# Patient Record
Sex: Male | Born: 1960
Health system: Southern US, Community
[De-identification: ages and names within clinical notes are randomized; demographics above are authoritative.]

## PROBLEM LIST (undated history)

## (undated) DIAGNOSIS — E7800 Pure hypercholesterolemia, unspecified: Secondary | ICD-10-CM

## (undated) DIAGNOSIS — R5381 Other malaise: Secondary | ICD-10-CM

## (undated) DIAGNOSIS — R5383 Other fatigue: Secondary | ICD-10-CM

## (undated) DIAGNOSIS — K219 Gastro-esophageal reflux disease without esophagitis: Secondary | ICD-10-CM

## (undated) DIAGNOSIS — J302 Other seasonal allergic rhinitis: Secondary | ICD-10-CM

## (undated) DIAGNOSIS — F959 Tic disorder, unspecified: Secondary | ICD-10-CM

## (undated) DIAGNOSIS — I639 Cerebral infarction, unspecified: Secondary | ICD-10-CM

## (undated) DIAGNOSIS — J9801 Acute bronchospasm: Secondary | ICD-10-CM

## (undated) DIAGNOSIS — K5909 Other constipation: Secondary | ICD-10-CM

## (undated) DIAGNOSIS — I4891 Unspecified atrial fibrillation: Secondary | ICD-10-CM

## (undated) HISTORY — DX: Pure hypercholesterolemia, unspecified: E78.00

## (undated) HISTORY — DX: Tic disorder, unspecified: F95.9

## (undated) HISTORY — PX: APPENDECTOMY: SHX54

## (undated) HISTORY — DX: Other malaise: R53.81

## (undated) HISTORY — DX: Other seasonal allergic rhinitis: J30.2

## (undated) HISTORY — DX: Gastro-esophageal reflux disease without esophagitis: K21.9

## (undated) HISTORY — DX: Other fatigue: R53.83

## (undated) HISTORY — DX: Cerebral infarction, unspecified: I63.9

## (undated) HISTORY — DX: Acute bronchospasm: J98.01

## (undated) HISTORY — DX: Other constipation: K59.09

---

## 2011-01-26 ENCOUNTER — Ambulatory Visit: Payer: Self-pay | Admitting: Emergency Medicine

## 2011-01-28 ENCOUNTER — Encounter: Payer: Self-pay | Admitting: *Deleted

## 2011-02-02 ENCOUNTER — Encounter: Payer: Self-pay | Admitting: *Deleted

## 2011-02-02 DIAGNOSIS — E7800 Pure hypercholesterolemia, unspecified: Secondary | ICD-10-CM

## 2011-02-02 DIAGNOSIS — J302 Other seasonal allergic rhinitis: Secondary | ICD-10-CM | POA: Insufficient documentation

## 2011-02-02 DIAGNOSIS — E349 Endocrine disorder, unspecified: Secondary | ICD-10-CM | POA: Insufficient documentation

## 2011-02-09 ENCOUNTER — Encounter: Payer: Self-pay | Admitting: Emergency Medicine

## 2011-02-09 ENCOUNTER — Ambulatory Visit (INDEPENDENT_AMBULATORY_CARE_PROVIDER_SITE_OTHER): Payer: Federal, State, Local not specified - PPO | Admitting: Emergency Medicine

## 2011-02-09 ENCOUNTER — Telehealth: Payer: Self-pay

## 2011-02-09 DIAGNOSIS — E785 Hyperlipidemia, unspecified: Secondary | ICD-10-CM

## 2011-02-09 DIAGNOSIS — E291 Testicular hypofunction: Secondary | ICD-10-CM

## 2011-02-09 LAB — LIPID PANEL
LDL Cholesterol: 122 mg/dL — ABNORMAL HIGH (ref 0–99)
Total CHOL/HDL Ratio: 3.9 Ratio
Triglycerides: 66 mg/dL (ref <150)
VLDL: 13 mg/dL (ref 0–40)

## 2011-02-09 MED ORDER — TESTOSTERONE 50 MG/5GM (1%) TD GEL: 5.0000 g | Freq: Every day | TRANSDERMAL | Status: DC

## 2011-02-09 NOTE — Telephone Encounter (Signed)
.  [facility] PT STATES HE WENT TO THE [facility] ON [address] TO GET HIS MEDS AND THEY WERE NOT THERE  HE IS TO GET SIX REFILLS (ONLY FIVE LISTED ON THE PAPERWORK DR. DAUB GAVE HIM) PLEASE CALL [phone]

## 2011-02-09 NOTE — Progress Notes (Signed)
  Subjective:    Patient ID: Cody Hall, male    DOB: March 18, 1960, 51 y.o.   MRN: [id number]  HPIPatient here to get a refill on his testosterone    Review of Systems  Constitutional: Negative.   HENT: Negative.   Eyes: Negative.   Respiratory: Negative.   Gastrointestinal: Negative.   Genitourinary: Negative.        Objective:   Physical Exam  Constitutional: He appears well-developed.  HENT:  Head: Normocephalic.  Eyes: Pupils are equal, round, and reactive to light.  Cardiovascular: Normal rate.           Assessment & Plan:  Will continue same dose of meds

## 2011-02-10 LAB — TESTOSTERONE, FREE, TOTAL, SHBG
Sex Hormone Binding: 39 nmol/L (ref 13–71)
Testosterone: 291.16 ng/dL (ref 250–890)

## 2011-02-10 NOTE — Telephone Encounter (Signed)
Spoke with pt who clarified he was questioning Rx for Androgel because it only had five RFs on script, but then wife realized that it was written for #1 now and then 5 more RFs. Pt does not have any other questions.

## 2011-08-13 ENCOUNTER — Other Ambulatory Visit: Payer: Self-pay | Admitting: Physician Assistant

## 2011-08-13 DIAGNOSIS — E291 Testicular hypofunction: Secondary | ICD-10-CM

## 2011-08-13 MED ORDER — TESTOSTERONE 50 MG/5GM (1%) TD GEL: 5.0000 g | Freq: Every day | TRANSDERMAL | Status: DC

## 2011-08-14 ENCOUNTER — Other Ambulatory Visit: Payer: Self-pay

## 2011-12-14 ENCOUNTER — Ambulatory Visit (INDEPENDENT_AMBULATORY_CARE_PROVIDER_SITE_OTHER): Payer: Federal, State, Local not specified - PPO | Admitting: Emergency Medicine

## 2011-12-14 ENCOUNTER — Encounter: Payer: Self-pay | Admitting: Emergency Medicine

## 2011-12-14 VITALS — BP 110/80 | HR 60 | Temp 98.3°F | Resp 16 | Ht 67.0 in | Wt 156.0 lb

## 2011-12-14 DIAGNOSIS — E291 Testicular hypofunction: Secondary | ICD-10-CM

## 2011-12-14 DIAGNOSIS — Z79899 Other long term (current) drug therapy: Secondary | ICD-10-CM

## 2011-12-14 DIAGNOSIS — R109 Unspecified abdominal pain: Secondary | ICD-10-CM

## 2011-12-14 DIAGNOSIS — K5900 Constipation, unspecified: Secondary | ICD-10-CM

## 2011-12-14 LAB — COMPREHENSIVE METABOLIC PANEL
ALT: 20 U/L (ref 0–53)
AST: 24 U/L (ref 0–37)
CO2: 27 mEq/L (ref 19–32)
Creat: 1.07 mg/dL (ref 0.50–1.35)
Sodium: 140 mEq/L (ref 135–145)
Total Bilirubin: 0.5 mg/dL (ref 0.3–1.2)
Total Protein: 6.5 g/dL (ref 6.0–8.3)

## 2011-12-14 LAB — TSH: TSH: 1.804 u[IU]/mL (ref 0.350–4.500)

## 2011-12-14 LAB — PSA: PSA: 0.37 ng/mL (ref <=4.00)

## 2011-12-14 MED ORDER — TESTOSTERONE 50 MG/5GM (1%) TD GEL: 5.0000 g | Freq: Every day | TRANSDERMAL | Status: DC

## 2011-12-14 NOTE — Progress Notes (Signed)
  Subjective:    Patient ID: Cody Hall, male    DOB: 18-Jul-1960, 51 y.o.   MRN: [id number]  HPI the past few weeks the patient has been bothered with alternating diarrhea and constipation. He his wife is placing on special diet now he seems to be having normal bowel movements in fact last week his stools were loose. He seems to now be on a regular schedule. The patient is 51 has not had has his screening colonoscopy. He has no family history of colon cancer. He also suffers from hypogonadism and is in for testosterone refill to    Review of Systems     Objective:   Physical Exam HEENT exam is unremarkable neck supple chest clear cardiac regular rate without murmurs the abdomen is flat liver and spleen is not enlarged prostate is normal size hemosure was done.  Results for orders placed in visit on 12/14/11  IFOBT (OCCULT BLOOD)      Component Value Range   IFOBT Negative          Assessment & Plan:  We'll schedule patient for screening colonoscopy. He is to continue his current medication regimen and diet regimen. I refilled his testosterone. Labs were done and were pending.

## 2011-12-15 ENCOUNTER — Encounter: Payer: Self-pay | Admitting: Gastroenterology

## 2011-12-21 LAB — TESTOSTERONE, FREE, TOTAL, SHBG
Testosterone, Free: 105.8 pg/mL (ref 47.0–244.0)
Testosterone-% Free: 1.9 % (ref 1.6–2.9)
Testosterone: 550

## 2011-12-23 ENCOUNTER — Other Ambulatory Visit: Payer: Self-pay | Admitting: *Deleted

## 2011-12-23 DIAGNOSIS — E7800 Pure hypercholesterolemia, unspecified: Secondary | ICD-10-CM

## 2012-01-12 DIAGNOSIS — I639 Cerebral infarction, unspecified: Secondary | ICD-10-CM

## 2012-01-12 HISTORY — DX: Cerebral infarction, unspecified: I63.9

## 2012-01-13 ENCOUNTER — Ambulatory Visit: Payer: Self-pay | Admitting: Gastroenterology

## 2012-01-20 ENCOUNTER — Encounter: Payer: Self-pay | Admitting: Gastroenterology

## 2012-01-20 ENCOUNTER — Ambulatory Visit: Payer: Federal, State, Local not specified - PPO

## 2012-01-20 ENCOUNTER — Ambulatory Visit (INDEPENDENT_AMBULATORY_CARE_PROVIDER_SITE_OTHER): Payer: Federal, State, Local not specified - PPO | Admitting: Gastroenterology

## 2012-01-20 ENCOUNTER — Other Ambulatory Visit: Payer: Self-pay | Admitting: *Deleted

## 2012-01-20 VITALS — BP 100/70 | HR 64 | Ht 68.0 in | Wt 154.0 lb

## 2012-01-20 DIAGNOSIS — K5900 Constipation, unspecified: Secondary | ICD-10-CM

## 2012-01-20 DIAGNOSIS — E7800 Pure hypercholesterolemia, unspecified: Secondary | ICD-10-CM

## 2012-01-20 LAB — CBC WITH DIFFERENTIAL/PLATELET
Eosinophils Absolute: 0.1 10*3/uL (ref 0.0–0.7)
MCHC: 33.1 g/dL (ref 30.0–36.0)
MCV: 88.5 fl (ref 78.0–100.0)
Monocytes Absolute: 0.5 10*3/uL (ref 0.1–1.0)
Neutrophils Relative %: 60.8 % (ref 43.0–77.0)
Platelets: 132 10*3/uL — ABNORMAL LOW (ref 150.0–400.0)
RDW: 13.6 % (ref 11.5–14.6)

## 2012-01-20 LAB — LIPID PANEL
Cholesterol: 198 mg/dL (ref 0–200)
LDL Cholesterol: 139 mg/dL — ABNORMAL HIGH (ref 0–99)

## 2012-01-20 LAB — IBC PANEL
Iron: 103 ug/dL (ref 42–165)
Transferrin: 248.2 mg/dL (ref 212.0–360.0)

## 2012-01-20 MED ORDER — PEG-KCL-NACL-NASULF-NA ASC-C 100 G PO SOLR: 1.0000 | Freq: Once | ORAL | Status: DC

## 2012-01-20 NOTE — Patient Instructions (Addendum)
You have been scheduled for a colonoscopy with propofol. Please follow written instructions given to you at your visit today.  Please pick up your prep kit at the pharmacy within the next 1-3 days. If you use inhalers (even only as needed) or a CPAP machine, please bring them with you on the day of your procedure. Your physician has requested that you go to the basement for lab work before leaving today. CC:  Cody Chris MD  Constipation, Adult Constipation is when a person has fewer than 3 bowel movements a week; has difficulty having a bowel movement; or has stools that are dry, hard, or larger than normal. As people grow older, constipation is more common. If you try to fix constipation with medicines that make you have a bowel movement (laxatives), the problem may get worse. Long-term laxative use may cause the muscles of the colon to become weak. A low-fiber diet, not taking in enough fluids, and taking certain medicines may make constipation worse. CAUSES   Certain medicines, such as antidepressants, pain medicine, iron supplements, antacids, and water pills.   Certain diseases, such as diabetes, irritable bowel syndrome (IBS), thyroid disease, or depression.   Not drinking enough water.   Not eating enough fiber-rich foods.   Stress or travel.  Lack of physical activity or exercise.  Not going to the restroom when there is the urge to have a bowel movement.  Ignoring the urge to have a bowel movement.  Using laxatives too much. SYMPTOMS   Having fewer than 3 bowel movements a week.   Straining to have a bowel movement.   Having hard, dry, or larger than normal stools.   Feeling full or bloated.   Pain in the lower abdomen.  Not feeling relief after having a bowel movement. DIAGNOSIS  Your caregiver will take a medical history and perform a physical exam. Further testing may be done for severe constipation. Some tests may include:   A barium enema X-ray to  examine your rectum, colon, and sometimes, your small intestine.  A sigmoidoscopy to examine your lower colon.  A colonoscopy to examine your entire colon. TREATMENT  Treatment will depend on the severity of your constipation and what is causing it. Some dietary treatments include drinking more fluids and eating more fiber-rich foods. Lifestyle treatments may include regular exercise. If these diet and lifestyle recommendations do not help, your caregiver may recommend taking over-the-counter laxative medicines to help you have bowel movements. Prescription medicines may be prescribed if over-the-counter medicines do not work.  HOME CARE INSTRUCTIONS   Increase dietary fiber in your diet, such as fruits, vegetables, whole grains, and beans. Limit high-fat and processed sugars in your diet, such as [place] fries, hamburgers, cookies, candies, and soda.   A fiber supplement may be added to your diet if you cannot get enough fiber from foods.   Drink enough fluids to keep your urine clear or pale yellow.   Exercise regularly or as directed by your caregiver.   Go to the restroom when you have the urge to go. Do not hold it.  Only take medicines as directed by your caregiver. Do not take other medicines for constipation without talking to your caregiver first. SEEK IMMEDIATE MEDICAL CARE IF:   You have bright red blood in your stool.   Your constipation lasts for more than 4 days or gets worse.   You have abdominal or rectal pain.   You have thin, pencil-like stools.  You have unexplained weight  loss. MAKE SURE YOU:   Understand these instructions.  Will watch your condition.  Will get help right away if you are not doing well or get worse. Document Released: 09/26/2003 Document Revised: 03/22/2011 Document Reviewed: 12/01/2010 [facility] Patient Information 2013 [place], [place].

## 2012-01-20 NOTE — Progress Notes (Signed)
History of Present Illness:  This is a 52 year old Caucasian male referred through the courtesy of Dr. Lesle Chris  because of several months of increasing constipation responsive to high fiber diet and" digestive enzymes".  This patient has not had previous colonoscopy or barium studies.  He's had some abdominal gas, bloating, discomfort over the last several months with a 15 pound weight loss.  He denies melena or hematochezia.  He currently is on a variety of fruits and fiber supplemental drinks and over-the-counter medications with good relief.  In the past he has had some gastroesophageal reflux with Advil use.  He denies current upper GI or hepatobiliary problems.  Family history is noncontributory.  He denies any systemic complaints such as fever or chills.  There is a vague history of iron deficiency as a child.  I have reviewed this patient's present history, medical and surgical past history, allergies and medications.     ROS: The remainder of the 10 point ROS is negative     Physical Exam:Blood pressure 100/70, pulse 64 and regular, and weight 154 pounds with BMI of 23.42.   General well developed well nourished patient in no acute distress, appearing their stated age Eyes PERRLA, no icterus, fundoscopic exam per opthamologist Skin no lesions noted Neck supple, no adenopathy, no thyroid enlargement, no tenderness Chest clear to percussion and auscultation Heart no significant murmurs, gallops or rubs noted Abdomen no hepatosplenomegaly masses or tenderness, BS normal.  Extremities no acute joint lesions, edema, phlebitis or evidence of cellulitis. Neurologic patient oriented x 3, cranial nerves intact, no focal neurologic deficits noted. Psychological mental status normal and normal affect.  Assessment and plan:New onset constipation in a middle-aged patient, rule out colonic polyposis or colon carcinoma versus colonic motility disorder.  I've scheduled him for diagnostic  colonoscopy, have reviewed the risk and benefits of this procedure.  I have advised him to continue by mouth fiber supplements and liberal by mouth fluids.  We will check anemia and celiac profile.  He is to bring in his" digestive enzymes" for review.  It certainly does not sound like he has chronic pancreatitis or chronic malabsorption syndrome.  Pertinent information concerning constipation given to this patient for review.  Encounter Diagnosis  Name Primary?  . Constipation Yes

## 2012-01-21 LAB — CELIAC PANEL 10
Gliadin IgG: 5.1 U/mL (ref <20)
Tissue Transglut Ab: 3.6 U/mL (ref <20)
Tissue Transglutaminase Ab, IgA: 2.3 U/mL (ref <20)

## 2012-02-07 ENCOUNTER — Ambulatory Visit (AMBULATORY_SURGERY_CENTER): Payer: Federal, State, Local not specified - PPO | Admitting: Gastroenterology

## 2012-02-07 ENCOUNTER — Encounter: Payer: Self-pay | Admitting: Gastroenterology

## 2012-02-07 VITALS — BP 117/68 | HR 56 | Temp 97.8°F | Resp 18 | Ht 67.0 in | Wt 156.0 lb

## 2012-02-07 DIAGNOSIS — K5900 Constipation, unspecified: Secondary | ICD-10-CM

## 2012-02-07 DIAGNOSIS — Z1211 Encounter for screening for malignant neoplasm of colon: Secondary | ICD-10-CM

## 2012-02-07 MED ORDER — SODIUM CHLORIDE 0.9 % IV SOLN: 500.0000 mL | INTRAVENOUS | Status: DC

## 2012-02-07 NOTE — Op Note (Signed)
[facility] [address] [place], [address]   COLONOSCOPY PROCEDURE REPORT  PATIENT: Cody Hall, Cody Hall  MR#: [id number] BIRTHDATE: 1960/05/22 , 51  yrs. old GENDER: Male ENDOSCOPIST: Mardella Layman, MD, [facility] REFERRED BY:  Lesle Chris, M.D. PROCEDURE DATE:  02/07/2012 PROCEDURE:   Colonoscopy, screening ASA CLASS:   Class I INDICATIONS:Average risk patient for colon cancer and Constipation.  MEDICATIONS: propofol (Diprivan) 150mg  IV  DESCRIPTION OF PROCEDURE:   After the risks and benefits and of the procedure were explained, informed consent was obtained.  A digital rectal exam revealed no abnormalities of the rectum.    The LB [id number]  endoscope was introduced through the anus and advanced to the cecum, which was identified by both the appendix and ileocecal valve .  The quality of the prep was excellent, using MoviPrep .  The instrument was then slowly withdrawn as the colon was fully examined.     COLON FINDINGS: A normal appearing cecum, ileocecal valve, and appendiceal orifice were identified.  The ascending, hepatic flexure, transverse, splenic flexure, descending, sigmoid colon and rectum appeared unremarkable.  No polyps or cancers were seen. Retroflexed views revealed internal hemorrhoids.     The scope was then withdrawn from the patient and the procedure completed.  COMPLICATIONS: There were no complications. ENDOSCOPIC IMPRESSION: Normal colon ...no polyps or cancer noted....  RECOMMENDATIONS: 1.  Continue current medications 2.  High fiber diet with liberal fluid intake. 3.  Metamucil or benefiber 4.  Repeat Colonscopy in 10 years.   REPEAT EXAM:  cc:  _______________________________ eSignedMardella Layman, MD, [facility] 02/07/2012 10:41 AM     PATIENT NAME:  Cody Hall, Cody Hall MR#: [id number]

## 2012-02-07 NOTE — Patient Instructions (Addendum)
Discharge instructions given with verbal understanding. Handout on hemorrhoids given. Resume previous medications. YOU HAD AN ENDOSCOPIC PROCEDURE TODAY AT THE [facility]: Refer to the procedure report that was given to you for any specific questions about what was found during the examination.  If the procedure report does not answer your questions, please call your gastroenterologist to clarify.  If you requested that your care partner not be given the details of your procedure findings, then the procedure report has been included in a sealed envelope for you to review at your convenience later.  YOU SHOULD EXPECT: Some feelings of bloating in the abdomen. Passage of more gas than usual.  Walking can help get rid of the air that was put into your GI tract during the procedure and reduce the bloating. If you had a lower endoscopy (such as a colonoscopy or flexible sigmoidoscopy) you may notice spotting of blood in your stool or on the toilet paper. If you underwent a bowel prep for your procedure, then you may not have a normal bowel movement for a few days.  DIET: Your first meal following the procedure should be a light meal and then it is ok to progress to your normal diet.  A half-sandwich or bowl of soup is an example of a good first meal.  Heavy or fried foods are harder to digest and may make you feel nauseous or bloated.  Likewise meals heavy in dairy and vegetables can cause extra gas to form and this can also increase the bloating.  Drink plenty of fluids but you should avoid alcoholic beverages for 24 hours.  ACTIVITY: Your care partner should take you home directly after the procedure.  You should plan to take it easy, moving slowly for the rest of the day.  You can resume normal activity the day after the procedure however you should NOT DRIVE or use heavy machinery for 24 hours (because of the sedation medicines used during the test).    SYMPTOMS TO REPORT IMMEDIATELY: A  gastroenterologist can be reached at any hour.  During normal business hours, 8:30 AM to 5:00 PM Monday through Friday, call [phone].  After hours and on weekends, please call the GI answering service at [phone] who will take a message and have the physician on call contact you.   Following lower endoscopy (colonoscopy or flexible sigmoidoscopy):  Excessive amounts of blood in the stool  Significant tenderness or worsening of abdominal pains  Swelling of the abdomen that is new, acute  Fever of 100F or higher  FOLLOW UP: If any biopsies were taken you will be contacted by phone or by letter within the next 1-3 weeks.  Call your gastroenterologist if you have not heard about the biopsies in 3 weeks.  Our staff will call the home number listed on your records the next business day following your procedure to check on you and address any questions or concerns that you may have at that time regarding the information given to you following your procedure. This is a courtesy call and so if there is no answer at the home number and we have not heard from you through the emergency physician on call, we will assume that you have returned to your regular daily activities without incident.  SIGNATURES/CONFIDENTIALITY: You and/or your care partner have signed paperwork which will be entered into your electronic medical record.  These signatures attest to the fact that that the information above on your After Visit Summary has   been reviewed and is understood.  Full responsibility of the confidentiality of this discharge information lies with you and/or your care-partner.  

## 2012-02-07 NOTE — Progress Notes (Signed)
Patient did not experience any of the following events: a burn prior to discharge; a fall within the facility; wrong site/side/patient/procedure/implant event; or a hospital transfer or hospital admission upon discharge from the facility. (G8907) Patient did not have preoperative order for IV antibiotic SSI prophylaxis. (G8918)  

## 2012-02-08 ENCOUNTER — Telehealth: Payer: Self-pay | Admitting: *Deleted

## 2012-02-08 NOTE — Telephone Encounter (Signed)
  Follow up Call-  Call back number 02/07/2012  Post procedure Call Back phone  # [phone]  Permission to leave phone message Yes     Patient questions:  Do you have a fever, pain , or abdominal swelling? no Pain Score  0 *  Have you tolerated food without any problems? yes  Have you been able to return to your normal activities? yes  Do you have any questions about your discharge instructions: Diet   no Medications  no Follow up visit  no  Do you have questions or concerns about your Care? no  Actions: * If pain score is 4 or above: No action needed, pain <4.

## 2012-02-09 ENCOUNTER — Encounter: Payer: Federal, State, Local not specified - PPO | Admitting: Gastroenterology

## 2012-03-17 ENCOUNTER — Emergency Department (HOSPITAL_COMMUNITY): Payer: Federal, State, Local not specified - PPO

## 2012-03-17 ENCOUNTER — Inpatient Hospital Stay (HOSPITAL_COMMUNITY)
Admission: EM | Admit: 2012-03-17 | Discharge: 2012-03-18 | DRG: 014 | Disposition: A | Discharge status: Home / Self Care | Payer: Federal, State, Local not specified - PPO | Attending: Family Medicine | Admitting: Family Medicine

## 2012-03-17 ENCOUNTER — Encounter (HOSPITAL_COMMUNITY): Payer: Self-pay | Admitting: *Deleted

## 2012-03-17 DIAGNOSIS — E291 Testicular hypofunction: Secondary | ICD-10-CM | POA: Diagnosis present

## 2012-03-17 DIAGNOSIS — I6529 Occlusion and stenosis of unspecified carotid artery: Secondary | ICD-10-CM | POA: Diagnosis present

## 2012-03-17 DIAGNOSIS — I6350 Cerebral infarction due to unspecified occlusion or stenosis of unspecified cerebral artery: Principal | ICD-10-CM | POA: Diagnosis present

## 2012-03-17 DIAGNOSIS — I639 Cerebral infarction, unspecified: Secondary | ICD-10-CM | POA: Diagnosis present

## 2012-03-17 DIAGNOSIS — E349 Endocrine disorder, unspecified: Secondary | ICD-10-CM | POA: Diagnosis present

## 2012-03-17 DIAGNOSIS — Z79899 Other long term (current) drug therapy: Secondary | ICD-10-CM

## 2012-03-17 DIAGNOSIS — K5909 Other constipation: Secondary | ICD-10-CM | POA: Diagnosis present

## 2012-03-17 DIAGNOSIS — Z880 Allergy status to penicillin: Secondary | ICD-10-CM

## 2012-03-17 DIAGNOSIS — J309 Allergic rhinitis, unspecified: Secondary | ICD-10-CM | POA: Diagnosis present

## 2012-03-17 DIAGNOSIS — Z888 Allergy status to other drugs, medicaments and biological substances status: Secondary | ICD-10-CM

## 2012-03-17 DIAGNOSIS — R413 Other amnesia: Secondary | ICD-10-CM | POA: Diagnosis present

## 2012-03-17 DIAGNOSIS — E785 Hyperlipidemia, unspecified: Secondary | ICD-10-CM | POA: Diagnosis present

## 2012-03-17 DIAGNOSIS — F0789 Other personality and behavioral disorders due to known physiological condition: Secondary | ICD-10-CM | POA: Diagnosis present

## 2012-03-17 DIAGNOSIS — K219 Gastro-esophageal reflux disease without esophagitis: Secondary | ICD-10-CM | POA: Diagnosis present

## 2012-03-17 DIAGNOSIS — Z9089 Acquired absence of other organs: Secondary | ICD-10-CM

## 2012-03-17 LAB — COMPREHENSIVE METABOLIC PANEL
AST: 28 U/L (ref 0–37)
Albumin: 4.4 g/dL (ref 3.5–5.2)
Calcium: 10 mg/dL (ref 8.4–10.5)
Creatinine, Ser: 1.03 mg/dL (ref 0.50–1.35)
Total Protein: 7.1 g/dL (ref 6.0–8.3)

## 2012-03-17 LAB — URINALYSIS, ROUTINE W REFLEX MICROSCOPIC
Ketones, ur: NEGATIVE mg/dL
Leukocytes, UA: NEGATIVE
Nitrite: NEGATIVE
Specific Gravity, Urine: 1.007 (ref 1.005–1.030)
pH: 6.5 (ref 5.0–8.0)

## 2012-03-17 LAB — CBC
MCH: 30 pg (ref 26.0–34.0)
MCV: 87.1 fL (ref 78.0–100.0)
Platelets: 163 10*3/uL (ref 150–400)
RDW: 13 % (ref 11.5–15.5)

## 2012-03-17 LAB — RAPID URINE DRUG SCREEN, HOSP PERFORMED
Benzodiazepines: NOT DETECTED
Cocaine: NOT DETECTED

## 2012-03-17 LAB — PROTIME-INR: INR: 0.98 (ref 0.00–1.49)

## 2012-03-17 LAB — GLUCOSE, CAPILLARY: Glucose-Capillary: 92 mg/dL (ref 70–99)

## 2012-03-17 MED ORDER — ASPIRIN 325 MG PO TABS
325.0000 mg | ORAL_TABLET | Freq: Every day | ORAL | Status: DC
  Administered 2012-03-18: 325 mg via ORAL
  Filled 2012-03-17: qty 1

## 2012-03-17 MED ORDER — SODIUM CHLORIDE 0.9 % IV SOLN: INTRAVENOUS | Status: DC

## 2012-03-17 MED ORDER — ACETAMINOPHEN 325 MG PO TABS
650.0000 mg | ORAL_TABLET | Freq: Four times a day (QID) | ORAL | Status: DC | PRN
  Administered 2012-03-18: 650 mg via ORAL
  Filled 2012-03-17: qty 2

## 2012-03-17 MED ORDER — VITAMIN D3 25 MCG (1000 UNIT) PO TABS
2000.0000 [IU] | ORAL_TABLET | Freq: Every day | ORAL | Status: DC
  Administered 2012-03-17: 2000 [IU] via ORAL
  Filled 2012-03-17 (×2): qty 2

## 2012-03-17 MED ORDER — SENNOSIDES-DOCUSATE SODIUM 8.6-50 MG PO TABS: 1.0000 | ORAL_TABLET | Freq: Every evening | ORAL | Status: DC | PRN

## 2012-03-17 MED ORDER — ASPIRIN 300 MG RE SUPP
300.0000 mg | Freq: Every day | RECTAL | Status: DC
  Filled 2012-03-17: qty 1

## 2012-03-17 MED ORDER — ONDANSETRON HCL 4 MG/2ML IJ SOLN: 4.0000 mg | Freq: Four times a day (QID) | INTRAMUSCULAR | Status: DC | PRN

## 2012-03-17 MED ORDER — VITAMIN D3 50 MCG (2000 UT) PO TABS: 1.0000 | ORAL_TABLET | Freq: Every day | ORAL | Status: DC

## 2012-03-17 MED ORDER — ATORVASTATIN CALCIUM 20 MG PO TABS
20.0000 mg | ORAL_TABLET | Freq: Every day | ORAL | Status: DC
  Administered 2012-03-18: 20 mg via ORAL
  Filled 2012-03-17 (×2): qty 1

## 2012-03-17 NOTE — Progress Notes (Signed)
Referring Physician: Dr. Leveda Anna    Chief Complaint: confusion  HPI: Cody Hall is an 52 y.o. 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 He did have a meeting just before this and issues were brought up that he did not recall from 2 weeks before. He believes that this all started around 0930 on 03/17/2012. He describes this as being "foggy". His wife is in the room and states he has no previous memory difficulties. He had right temporal headache. He has a history of migraines years ago, but none recently and this is unlike them. He did have a viral syndrome with vomiting about 3 weeks ago and describes a chest pain at that time.  He did have palpitations years ago, saw Dr. Verdis Prime. He cut out his caffeine, decreased his stress level and they went away. He stated that a couple of weeks ago that he did have a "few seconds" of palpitations that he had not had since seeing Dr. Katrinka Blazing.  Date last known well: 03/17/2012 Time last known well: 0930 NIHSS=0  tPA Given: No: symptoms resolved. NIHSS=0.  Past Medical History  Diagnosis Date  . Low testosterone   . Other malaise and fatigue   . Bronchospasm   . Seasonal allergies   . Reflux   . Tic     FACIAL  . Elevated cholesterol     LDL  . Chronic constipation   . GERD (gastroesophageal reflux disease)     Past Surgical History  Procedure Laterality Date  . Appendectomy      Family History  Problem Relation Age of Onset  . Colon cancer Neg Hx   . Colon polyps Neg Hx   . Ovarian cancer Mother    Social History:  reports that he has never smoked. He has never used smokeless tobacco. He reports that he does not drink alcohol or use illicit drugs.  Allergies:  Allergies  Allergen Reactions  . Flexeril (Cyclobenzaprine Hcl) Other (See Comments)    Unknown   . Meloxicam     AR   nasal congestion  . Penicillins     childhood    Current  Facility-Administered Medications  Medication Dose Route Frequency Provider Last Rate Last Dose  . [START ON 03/18/2012] aspirin suppository 300 mg  300 mg Rectal Daily Bryan R Hess, DO       Or  . Melene Muller ON 03/18/2012] aspirin tablet 325 mg  325 mg Oral Daily Bryan R Hess, DO      . atorvastatin (LIPITOR) tablet 20 mg  20 mg Oral q1800 Bryan R Hess, DO      . ondansetron (ZOFRAN) injection 4 mg  4 mg Intravenous Q6H PRN Briscoe Deutscher, DO       Current Outpatient Prescriptions  Medication Sig Dispense Refill  . Cholecalciferol (VITAMIN D3) 2000 UNITS TABS Take 1 tablet by mouth at bedtime.       Marland Kitchen DHEA 25 MG CAPS Take 1 capsule by mouth.      . testosterone (ANDROGEL) 50 MG/5GM GEL Place 5 g onto the skin daily. Needs office visit before out  30 Package  5   ROS: History obtained from spouse and the patient  General ROS: negative for - chills, fatigue, fever, night sweats, weight gain or weight loss Psychological ROS: negative for - behavioral disorder, hallucinations, mood swings or suicidal ideation, + memory difficulties (today only) Ophthalmic ROS: negative for -  blurry vision, double vision, eye pain or loss of vision, right sided headache today only ENT ROS: negative for - epistaxis, nasal discharge, oral lesions, sore throat, tinnitus or vertigo Allergy and Immunology ROS: negative for - hives or itchy/watery eyes Hematological and Lymphatic ROS: negative for - bleeding problems, bruising or swollen lymph nodes Endocrine ROS: negative for - galactorrhea, hair pattern changes, polydipsia/polyuria or temperature intolerance Respiratory ROS: negative for - cough, hemoptysis, shortness of breath or wheezing Cardiovascular ROS: negative for - , dyspnea on exertion, edema,  + irregular heartbeat 2 weeks ago, +chest pain over past few weeks when he had a virus vomiting Gastrointestinal ROS: negative for - abdominal pain, diarrhea, hematemesis, stool incontinence, +vomiting 3 weeks ago with chest  pain Genito-Urinary ROS: negative for - dysuria, hematuria, incontinence or urinary frequency/urgency Musculoskeletal ROS: negative for - joint swelling or muscular weakness Neurological ROS: as noted in HPI Dermatological ROS: negative for rash and skin lesion changes   Physical Examination: Blood pressure 121/76, pulse 73, temperature 98.1 F (36.7 C), temperature source Oral, resp. rate 17, SpO2 100.00%.  Neurologic Examination: Mental Status: Alert, oriented, thought content appropriate.  Speech fluent without evidence of aphasia.  Able to follow 3 step commands without difficulty. Cranial Nerves: II: visual fields grossly normal, pupils equal, round, reactive to light and accommodation III,IV, VI: ptosis not present, extra-ocular motions intact bilaterally V,VII: smile symmetric, facial light touch sensation normal bilaterally VIII: hearing normal bilaterally IX,X: gag reflex present XI: trapezius strength/neck flexion strength normal bilaterally XII: tongue strength normal  Motor: Right : Upper extremity   5/5    Left:     Upper extremity   5/5  Lower extremity   5/5     Lower extremity   5/5 Tone and bulk:normal tone throughout; no atrophy noted Sensory: Pinprick and light touch intact throughout, bilaterally Deep Tendon Reflexes: 2+ and symmetric throughout Plantars: Right: downgoing   Left: downgoing Cerebellar: normal finger-to-nose, normal rapid alternating movements and normal heel-to-shin test normal gait and station   Results for orders placed during the hospital encounter of 03/17/12 (from the past 48 hour(s))  GLUCOSE, CAPILLARY     Status: None   Collection Time    03/17/12 11:39 AM      Result Value Range   Glucose-Capillary 92  70 - 99 mg/dL   Comment 1 Documented in Chart     Comment 2 Notify RN    CBC     Status: None   Collection Time    03/17/12 11:47 AM      Result Value Range   WBC 5.7  4.0 - 10.5 K/uL   RBC 5.03  4.22 - 5.81 MIL/uL    Hemoglobin 15.1  13.0 - 17.0 g/dL   HCT 46.9  62.9 - 52.8 %   MCV 87.1  78.0 - 100.0 fL   MCH 30.0  26.0 - 34.0 pg   MCHC 34.5  30.0 - 36.0 g/dL   RDW 41.3  24.4 - 01.0 %   Platelets 163  150 - 400 K/uL  COMPREHENSIVE METABOLIC PANEL     Status: Abnormal   Collection Time    03/17/12 11:47 AM      Result Value Range   Sodium 139  135 - 145 mEq/L   Potassium 4.5  3.5 - 5.1 mEq/L   Chloride 100  96 - 112 mEq/L   CO2 30  19 - 32 mEq/L   Glucose, Bld 102 (*) 70 - 99 mg/dL  BUN 15  6 - 23 mg/dL   Creatinine, Ser 6.21  0.50 - 1.35 mg/dL   Calcium 30.8  8.4 - 65.7 mg/dL   Total Protein 7.1  6.0 - 8.3 g/dL   Albumin 4.4  3.5 - 5.2 g/dL   AST 28  0 - 37 U/L   ALT 34  0 - 53 U/L   Alkaline Phosphatase 48  39 - 117 U/L   Total Bilirubin 0.3  0.3 - 1.2 mg/dL   GFR calc non Af Amer 82 (*) >90 mL/min   GFR calc Af Amer >90  >90 mL/min   Comment:            The eGFR has been calculated     using the CKD EPI equation.     This calculation has not been     validated in all clinical     situations.     eGFR's persistently     <90 mL/min signify     possible Chronic Kidney Disease.  PROTIME-INR     Status: None   Collection Time    03/17/12 11:47 AM      Result Value Range   Prothrombin Time 12.9  11.6 - 15.2 seconds   INR 0.98  0.00 - 1.49  URINALYSIS, ROUTINE W REFLEX MICROSCOPIC     Status: None   Collection Time    03/17/12 12:13 PM      Result Value Range   Color, Urine YELLOW  YELLOW   APPearance CLEAR  CLEAR   Specific Gravity, Urine 1.007  1.005 - 1.030   pH 6.5  5.0 - 8.0   Glucose, UA NEGATIVE  NEGATIVE mg/dL   Hgb urine dipstick NEGATIVE  NEGATIVE   Bilirubin Urine NEGATIVE  NEGATIVE   Ketones, ur NEGATIVE  NEGATIVE mg/dL   Protein, ur NEGATIVE  NEGATIVE mg/dL   Urobilinogen, UA 0.2  0.0 - 1.0 mg/dL   Nitrite NEGATIVE  NEGATIVE   Leukocytes, UA NEGATIVE  NEGATIVE   Comment: MICROSCOPIC NOT DONE ON URINES WITH NEGATIVE PROTEIN, BLOOD, LEUKOCYTES, NITRITE, OR GLUCOSE  <1000 mg/dL.  URINE RAPID DRUG SCREEN (HOSP PERFORMED)     Status: None   Collection Time    03/17/12 12:13 PM      Result Value Range   Opiates NONE DETECTED  NONE DETECTED   Cocaine NONE DETECTED  NONE DETECTED   Benzodiazepines NONE DETECTED  NONE DETECTED   Amphetamines NONE DETECTED  NONE DETECTED   Tetrahydrocannabinol NONE DETECTED  NONE DETECTED   Barbiturates NONE DETECTED  NONE DETECTED   Comment:            DRUG SCREEN FOR MEDICAL PURPOSES     ONLY.  IF CONFIRMATION IS NEEDED     FOR ANY PURPOSE, NOTIFY LAB     WITHIN 5 DAYS.                LOWEST DETECTABLE LIMITS     FOR URINE DRUG SCREEN     Drug Class       Cutoff (ng/mL)     Amphetamine      1000     Barbiturate      200     Benzodiazepine   200     Tricyclics       300     Opiates          300     Cocaine          300  THC              50  TROPONIN I     Status: None   Collection Time    03/17/12  3:26 PM      Result Value Range   Troponin I <0.30  <0.30 ng/mL   Comment:            Due to the release kinetics of cTnI,     a negative result within the first hours     of the onset of symptoms does not rule out     myocardial infarction with certainty.     If myocardial infarction is still suspected,     repeat the test at appropriate intervals.   Dg Chest 2 View 03/17/2012 No active cardiopulmonary process.    Ct Head Wo Contrast  03/17/2012   No acute intracranial abnormality.    Mr Brain Wo Contrast 03/17/2012 Tiny area of restricted motion medial left hippocampus most suggestive of tiny acute infarct.  Tiny area of blood breakdown products posterior right frontal lobe. Question result of prior trauma, prior ischemia or tiny cavernoma.  Assessment: 52 y.o. male with episode of transient amnesia earlier today. MRI shows small medial left hippocampus infarct. I discussed stroke workup with this patient. I also discussed dysrhythmia in this patient and workup which included inpatient and outpatient  telemetry monitoring/TEE. Of note, he did take aspirin at work, two tablets of 325mg  aspirin. I would have him start aspirin 325mg  daily in am. Patient passed bedside swallow screen.  Stroke Risk Factors - hyperlipidemia and possible dysrythmia based on patients description  Plan: 1. HgbA1c, fasting lipid panel 2. MRI, MRA  of the brain without contrast 3. PT consult, OT consult, Speech consult 4. Echocardiogram 5. Carotid dopplers 6. Prophylactic therapy-Antiplatelet med: Aspirin - dose 325mg  7. Risk factor modification 8. Telemetry monitoring-concern for paroxysmal atrial fibrillation. 9. Frequent neuro checks   Guy Franco PA-C, MBA, MHA [facility] Pager [phone]  I personally participated in this patient's care including improving the above clinical assessment and management recommendations.  Venetia Maxon M.D. [facility] 03/17/2012, 5:07 PM

## 2012-03-17 NOTE — ED Notes (Signed)
Patient transported to MRI 

## 2012-03-17 NOTE — ED Notes (Signed)
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  Pt ao x 4, though processing slowly.  No facial droop or other neuro deficits.  CBG 96.  Pt c/o tightness to R eye and R temple.

## 2012-03-17 NOTE — ED Notes (Signed)
Dr Rubin Payor does not think pt is code stroke.  States to order head CT.

## 2012-03-17 NOTE — H&P (Signed)
Family Medicine Teaching [facility] Admission History and Physical Service Pager: [phone]  Patient name: Cody Hall Medical record number: [id number] Date of birth: June 12, 1960 Age: 52 y.o. Gender: male  Primary Care Provider: Lucilla Edin, MD  Chief Complaint: Amnesia  Assessment and Plan: Cody Hall is a 52 y.o. year old male presenting with amnesia beginning today and found to have acute CVA on MRI.   1. Acute Hippocampal Left sided CVA - Pt found to have acute left sided stroke on MRI in the hippocampal area 1. Consult to Neurology, greatly appreciate recommendations.  Not tPA candidate as pt > 4 hrs prior to admission.  2. Will tx with ASA in the acute setting starting tomorrow and switch to Plavix upon discharge.  Will start Lipitor 20 mg as well  3. Will risk stratify with 2 D Echo, TSH, A1C, Lipid Profile, Carotid Dopplers 4. PT/OT consult, RN swallow study 5. Place on telemetry and will get troponin x 1 6. Vitals and Neuro checks per unit  2. Low Testosterone - Will hold Testosterone/DHA while in hospital 3. HLD - Will repeat Lipid Panel 1. Will start on low dose Lipitor while in the hospital  4. FEN/GI: 100 cc/hr, NPO until passes swallow eval 5. Prophylaxis: Will give ASA 325 mg for acute CVA.  Also will defer Heparin SQ for 48 hrs. SCDs 6. Disposition: Pending work up  7. Code Status: Full Code   History of Present Illness: Cody Hall is a 52 y.o. year old male w/ PMHx of hyperlipidemia, GERD, allergic rhinitis, and low testosterone who is presenting with memory loss x 1 day found to have acute hippocampal CVA on MRI.    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 Around 9:00 he also was having difficulty remembering details from a work meeting. He could not remember where he was supposed to go for his route, which  was very concerning for him due to the fact that he is able to remember names for his route.  He talked to a co-worker, at which point he started to be concerned for a stroke.  He took three ASA along with eating a banana at this point.  He did notice that his right temple was swollen along with pressure but denies HA, tinnitus, weakness, blurred vision, diplopia, dysphagia.  By the time the patient came to the hospital, he started to regain his memory.  Pt has stated that he has not had this feeling before and has not had previous episodes of short lasting periods of dysphagia, weakness, diplopia, blurred vision.   Pt presented to the ED with memory loss where they performed multiple tests.  In the ED, they got a CXR which was negative for acute cardiopulmonary disease, a CT scan that did not show acute hemorrhage, a UDS which was negative, UA which was negative for infection, and a BMP which was WNL, a WBC WNL, and neurology was consulted.  They recommended performing a MRI without contrast showing a left hippocampal stroke.  Pt has PMHx for hyperlipidemia, follow by [facility].  Pt also recently had colonoscopy that did not show malignant lesions. Pt denies smoking or ever smoking, occasional drinking, no IV drug use, and no FMHx of stroke.     Pt denies CP, SOB, abdomina pain, N/V/D, leg pain or swelling,  fatigue.   Patient Active Problem List  Diagnosis  . Elevated cholesterol  . Reflux  . Seasonal allergies  . Low testosterone   Past Medical History: Past Medical History  Diagnosis Date  . Low testosterone   . Other malaise and fatigue   . Bronchospasm   . Seasonal allergies   . Reflux   . Tic -from pmh not reported by patient during this admission though.     FACIAL  . Elevated cholesterol     LDL  . Chronic constipation   . GERD (gastroesophageal reflux disease)    Past Surgical History: Past Surgical History  Procedure Laterality Date  . Appendectomy     Social  History: History  Substance Use Topics  . Smoking status: Never Smoker   . Smokeless tobacco: Never Used  . Alcohol Use: Occasional such as 1-2x monthly   For any additional social history documentation, please refer to relevant sections of EMR.  Family History: Family History  Problem Relation Age of Onset  . Colon cancer Neg Hx   . Colon polyps Neg Hx   . Ovarian cancer Mother    Allergies: Allergies  Allergen Reactions  . Flexeril (Cyclobenzaprine Hcl) Other (See Comments)    Unknown   . Meloxicam     AR   nasal congestion  . Penicillins     childhood   No current facility-administered medications on file prior to encounter.   Current Outpatient Prescriptions on File Prior to Encounter  Medication Sig Dispense Refill  . Cholecalciferol (VITAMIN D3) 2000 UNITS TABS Take 1 tablet by mouth at bedtime.       Marland Kitchen DHEA 25 MG CAPS Take 1 capsule by mouth.      . testosterone (ANDROGEL) 50 MG/5GM GEL Place 5 g onto the skin daily. Needs office visit before out  30 Package  5   Review Of Systems: Per HPI with the following additions: None Otherwise 12 point review of systems was performed and was unremarkable.  Physical Exam: BP 144/88  Pulse 76  Temp(Src) 97.7 F (36.5 C) (Oral)  Resp 20  SpO2 100% Exam: General: NAD, comfortable in bed HEENT: [place]/AT, PERRLA, EOMI B/L Cardiovascular: RRR, no murmur/gallop/rub Respiratory: CTA B/L, no wheezes Abdomen: Soft/NT/ND,  Extremities: no edema Skin: intact, no rashes Neuro: AAO x 3, CN 2-12 intact, MS 5/5 UE and LE B/L, no dysdiadochocinesia, finger to nose intact   Labs and Imaging: CBC BMET   Recent Labs Lab 03/17/12 1147  WBC 5.7  HGB 15.1  HCT 43.8  PLT 163   MRI: IMPRESSION:  Tiny area of restricted motion medial left hippocampus most  suggestive of tiny acute infarct.  Tiny area of blood breakdown products posterior right frontal lobe.  Question result of prior trauma, prior ischemia or tiny cavernoma.    Recent Labs Lab 03/17/12 1147  NA 139  K 4.5  CL 100  CO2 30  BUN 15  CREATININE 1.03  GLUCOSE 102*  CALCIUM 10.0      Bryan R. Hess, DO of [facility] 03/17/2012, 3:33 PM  Family Medicine Upper Level Addendum:   I have seen and examined the patient independently, discussed with Dr. Paulina Fusi, fully reviewed the H+P and agree with it's contents with the additions as noted in blue text.    Tana Conch, MD, PGY2 03/17/2012 4:13 PM

## 2012-03-17 NOTE — ED Notes (Addendum)
Teaching Services called at 1645 due to pt requesting NOT to have an PIV placed. Dr. Paulina Fusi stated that if pt passed swallow evaluation, pt would not need a PIV.

## 2012-03-18 DIAGNOSIS — K219 Gastro-esophageal reflux disease without esophagitis: Secondary | ICD-10-CM

## 2012-03-18 LAB — LIPID PANEL
HDL: 51 mg/dL (ref >39)
LDL Cholesterol: 108 mg/dL — ABNORMAL HIGH (ref 0–99)
Total CHOL/HDL Ratio: 3.5 RATIO
VLDL: 17 mg/dL (ref 0–40)

## 2012-03-18 LAB — ANTITHROMBIN III: AntiThromb III Func: 100 % (ref 75–120)

## 2012-03-18 LAB — RAPID URINE DRUG SCREEN, HOSP PERFORMED: Opiates: NOT DETECTED

## 2012-03-18 MED ORDER — HEPARIN SODIUM (PORCINE) 5000 UNIT/ML IJ SOLN
5000.0000 [IU] | Freq: Three times a day (TID) | INTRAMUSCULAR | Status: DC
  Filled 2012-03-18 (×3): qty 1

## 2012-03-18 MED ORDER — ATORVASTATIN CALCIUM 20 MG PO TABS: 20.0000 mg | ORAL_TABLET | Freq: Every day | ORAL | Status: DC

## 2012-03-18 MED ORDER — ASPIRIN 325 MG PO TABS: 325.0000 mg | ORAL_TABLET | Freq: Every day | ORAL | Status: DC

## 2012-03-18 NOTE — H&P (Signed)
Seen and examined.  Discussed with Dr. Durene Cal.  Agree with his management and documentation.  Briefly, 53 yo male with acute onset of memory loss that turned out to be an acute hippocampal CVA.  We are completing the CVA work up.  Treating with ASA and statin.  Likely DC this afternoon once work up complete.  Fortunately, his memory seems to be promptly returning to normal.

## 2012-03-18 NOTE — Progress Notes (Signed)
Family Medicine Teaching Service Daily Progress Note Service Page: [phone]  Subjective:  Feeling well this am.  Denies weakness, numbness, tingling.  Patient is eager to go home.  Objective: Temp:  [97.7 F (36.5 C)-98.4 F (36.9 C)] 98.4 F (36.9 C) (03/08 0520) Pulse Rate:  [57-82] 57 (03/08 0520) Resp:  [14-20] 20 (03/08 0520) BP: (89-144)/(53-88) 98/72 mmHg (03/08 0643) SpO2:  [94 %-100 %] 98 % (03/08 0520) Weight:  [155 lb 6.4 oz (70.489 kg)] 155 lb 6.4 oz (70.489 kg) (03/07 1920)  Exam: General: well appearing gentleman in NAD. Cardiovascular: RRR. No murmurs, rubs, or gallops. Respiratory: CTAB. No rales, rhonchi, or wheezing. Abdomen: soft, nontender, nondistended. Extremities: warm, well perfused. No edema. Neuro: AO x 3. CN 2-12 grossly intact. No weakness appreciated (muscle strength 5/5 in all extremities). Sensation grossly intact.  CBC BMET   Recent Labs Lab 03/17/12 1147  WBC 5.7  HGB 15.1  HCT 43.8  PLT 163    Recent Labs Lab 03/17/12 1147  NA 139  K 4.5  CL 100  CO2 30  BUN 15  CREATININE 1.03  GLUCOSE 102*  CALCIUM 10.0     Lipid Panel     Component Value Date/Time   CHOL 176 03/18/2012 0535   TRIG 84 03/18/2012 0535   HDL 51 03/18/2012 0535   CHOLHDL 3.5 03/18/2012 0535   VLDL 17 03/18/2012 0535   LDLCALC 108* 03/18/2012 0535   Imaging/Diagnostic Tests:  Dg Chest 2 View 03/17/2012   IMPRESSION: No active cardiopulmonary process.   Ct Head Wo Contrast 03/17/2012 IMPRESSION: No acute intracranial abnormality.   Mr Brain Wo Contrast 03/17/2012  IMPRESSION: Tiny area of restricted motion medial left hippocampus most suggestive of tiny acute infarct.  Tiny area of blood breakdown products posterior right frontal lobe. Question result of prior trauma, prior ischemia or tiny cavernoma.    Assessment/Plan: Cody Hall is a 52 y.o. year old male presenting with amnesia beginning today and found to have acute CVA on MRI.   # Acute Hippocampal Left  sided CVA  - Pt found to have acute left sided stroke on MRI in the hippocampal area  - Neurology following and we greatly appreciate their help - Stroke work up underway: Awaiting Echo, Carotid dopplers, A1C, TSH. - PT/OT eval and treat - Will continue daily aspirin  # Low Testosterone - Will hold Testosterone/DHA while in hospital  #  HLD  - Lipid panel revealed LDL of 108 - Patient started on lipitor 20mg  daily.  FEN/GI: Saline Lock IV.  Heart Healthy diet (passed swallow screen) Prophylaxis: Heparin SQ Disposition: Pending work up; Possible D/C home this afternoon. Code Status: Full Code   Everlene Other, DO 03/18/2012, 7:32 AM

## 2012-03-18 NOTE — Discharge Summary (Signed)
Family Medicine Teaching [facility] Discharge Summary  Patient name: Cody Hall Medical record number: [id number] Date of birth: [birth year] Age: 52 y.o. 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 year old male presented with amnesia and was subsequently found to have acute CVA on MRI.   1) Amnesia secondary to Acute Hippocampal CVA - MRI revealed acute hippocampal infarct. CT was negative for acute intracranial findings. - Patient was not a tPA candidate as patient presented >4 hours after symptoms - Neurology was consulted in the ED and patient was started on aspirin therapy. - Stroke work was obtained: Echo, carotid dopplers, HbA1C, Lipid panel, TSH. Given age and minimal PMH, hypercoagulable panel was obtained.  Much of the workup was pending at time of discharge. - Patient started on statin therapy during admission. - Patient was well appearing and had no focal neurological deficits at time of discharge.  2) Hyperlipidemia - Lipid panel was obtained and revealed LDL of 108 - Patient was started on Lipitor 20 mg daily  3) Low Testosterone - Patient was not continued on Testosterone during admission  Significant Labs and Imaging:   CBC BMET   Recent Labs Lab 03/17/12 1147  WBC 5.7  HGB 15.1  HCT 43.8  PLT 163    Recent Labs Lab 03/17/12 1147  NA 139  K 4.5  CL 100  CO2 30  BUN 15  CREATININE 1.03  GLUCOSE 102*  CALCIUM 10.0     Lipid Panel     Component Value Date/Time   CHOL 176 03/18/2012 0535   TRIG 84 03/18/2012 0535   HDL 51 03/18/2012 0535   CHOLHDL 3.5 03/18/2012 0535   VLDL 17 03/18/2012 0535   LDLCALC 108* 03/18/2012 0535   Dg  Chest 2 View  03/17/2012 IMPRESSION: No active cardiopulmonary process.   Ct Head Wo Contrast  03/17/2012 IMPRESSION: No acute intracranial abnormality.   Mr Brain Wo Contrast  03/17/2012 IMPRESSION: Tiny area of restricted motion medial left hippocampus most suggestive of tiny acute infarct. Tiny area of blood breakdown products posterior right frontal lobe. Question result of prior trauma, prior ischemia or tiny cavernoma.   Procedures: None  Consultations: Neurology, Stroke team  Discharge Medications:    Medication List    TAKE these medications       aspirin 325 MG tablet  Take 1 tablet (325 mg total) by mouth daily.     atorvastatin 20 MG tablet  Commonly known as:  LIPITOR  Take 1 tablet (20 mg total) by mouth daily at 6 PM.     DHEA 25 MG Caps  Take 1 capsule by mouth.     testosterone 50 MG/5GM Gel  Commonly known as:  ANDROGEL  Place 5 g onto the skin daily. Needs office visit before out     Vitamin D3 2000 UNITS Tabs  Take 1 tablet by mouth at bedtime.       Issues for Follow Up:  1) Please follow up on outstanding results - see below 2) Compliance with daily Aspirin 3) Compliance with statin.  Patient expressed concern about taking statin and stated that he would likely not take it on discharge. Please counsel patient about the importance of statin therapy.  Outstanding  Results: Hypercoagulable panel (antithrombin III, protein C, protein S, Lupus anticoagulant, homocysteine, factor v leiden, cardiolipin antibody); TSH, A1C; 2D Echo; Carotid dopplers.  Discharge Instructions: Patient was counseled important signs and symptoms that should prompt return to medical care, changes in medications, dietary instructions, activity restrictions, and follow up appointments.        Follow-up Information   Schedule an appointment as soon as possible for a visit with DAUB, Stan Head, MD.   Contact information:   [address] [place] [address] [phone]        Follow up with Gates Rigg, MD. Schedule an appointment as soon as possible for a visit in 6 weeks.   Contact information:   [address], [address] [facility] [place] [address] [phone]       Discharge Condition: Stable. Discharged home.  Everlene Other, DO 03/18/2012, 8:22 PM

## 2012-03-18 NOTE — Progress Notes (Signed)
Pt anxious to be discharged, no compliants voiced at this time.  States he feels he is at his normal.

## 2012-03-18 NOTE — Progress Notes (Signed)
VASCULAR LAB PRELIMINARY  PRELIMINARY  PRELIMINARY  PRELIMINARY  Carotid Dopplers completed.    Preliminary report:  There is <40% right ICA stenosis, distally.  There is no left ICA stenosis.  Bilateral vertebral artery flow is antegrade.  Daune Colgate, RVT 03/18/2012, 9:49 AM

## 2012-03-18 NOTE — Evaluation (Signed)
Occupational Therapy Evaluation Patient Details Name: Cody Hall MRN: [id number] DOB: [birth year] Today's Date: 03/18/2012 Time: 4098-1191 OT Time Calculation (min): 19 min  OT Assessment / Plan / Recommendation Clinical Impression  Pt admitted with memory difficulties. MRI showed acute infarct in left hippocampus. Pt at baseline during eval session. Provided pt with education on stroke signs and symptoms. Will sign off.    OT Assessment  Patient does not need any further OT services    Follow Up Recommendations  No OT follow up    Barriers to Discharge      Equipment Recommendations  None recommended by OT    Recommendations for Other Services    Frequency       Precautions / Restrictions     Pertinent Vitals/Pain See vitals    ADL  Eating/Feeding: Performed;Independent Where Assessed - Eating/Feeding: Edge of bed Upper Body Bathing: Simulated;Independent Where Assessed - Upper Body Bathing: Unsupported sitting Lower Body Bathing: Simulated;Independent Where Assessed - Lower Body Bathing: Unsupported sit to stand Upper Body Dressing: Performed;Independent Where Assessed - Upper Body Dressing: Unsupported sitting Lower Body Dressing: Performed;Independent Where Assessed - Lower Body Dressing: Unsupported sit to stand Toilet Transfer: Simulated;Independent Toilet Transfer Method: Sit to Barista:  (bed) Equipment Used:  (none) Transfers/Ambulation Related to ADLs: independent ADL Comments: Performed high level cognitive tasks while ambulating in hallway (performed mapping tasks, described route from hospital to [facility]) which he was not able to do yesterday per pt report.    OT Diagnosis:    OT Problem List:   OT Treatment Interventions:     OT Goals    Visit Information  Last OT Received On: 03/18/12 Assistance Needed: +1 PT/OT Co-Evaluation/Treatment: Yes    Subjective Data      Prior Functioning     Home  Living Lives With: Family Available Help at Discharge: Family;Available PRN/intermittently Type of Home: House Home Access: Stairs to enter [employer] of Steps: 5 Entrance Stairs-Rails: Right Home Layout: Two level;Bed/bath upstairs Alternate Level Stairs-Number of Steps: 12 Alternate Level Stairs-Rails: Right Bathroom Shower/Tub: Engineer, manufacturing systems: Standard Home Adaptive Equipment: None Prior Function Level of Independence: Independent Able to Take Stairs?: Yes Driving: Yes Vocation: Full time employment Communication Communication: No difficulties Dominant Hand: Right         Vision/Perception Vision - History Baseline Vision: No visual deficits Patient Visual Report: No change from baseline   Cognition  Cognition Overall Cognitive Status: Appears within functional limits for tasks assessed/performed Arousal/Alertness: Awake/alert Orientation Level: Appears intact for tasks assessed;Oriented X4 / Intact Behavior During Session: [facility] for tasks performed    Extremity/Trunk Assessment Right Upper Extremity Assessment RUE ROM/Strength/Tone: Within functional levels Left Upper Extremity Assessment LUE ROM/Strength/Tone: Within functional levels Right Lower Extremity Assessment RLE ROM/Strength/Tone: Within functional levels Left Lower Extremity Assessment LLE ROM/Strength/Tone: Within functional levels     Mobility Bed Mobility Bed Mobility: Supine to Sit;Sitting - Scoot to Edge of Bed Supine to Sit: 7: Independent Sitting - Scoot to [employer] of Bed: 7: Independent Transfers Transfers: Sit to Stand;Stand to Sit Sit to Stand: 7: Independent Stand to Sit: 7: Independent     Exercise Other Exercises Other Exercises: cognitive exercises; giving directions to various locations; able to navigate Unit to find assigned rooms; able to recite higher level information (was not able to do this yesterday per patient)   Balance Balance Balance  Assessed: Yes High Level Balance High Level Balance Activites: Side stepping;Backward walking;Direction changes;Turns;Sudden stops;Head turns High  Level Balance Comments: Independent with activites   End of Session OT - End of Session Activity Tolerance: Patient tolerated treatment well Patient left: in bed;with call bell/phone within reach Nurse Communication: Mobility status  GO   03/18/2012 Cipriano Mile OTR/L Pager [phone] Office [phone]   Cipriano Mile 03/18/2012, 3:23 PM

## 2012-03-18 NOTE — Progress Notes (Signed)
Stroke Team Progress Note  HISTORY Cody Hall is an 52 y.o. 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 He did have a meeting just before this and issues were brought up that he did not recall from 2 weeks before. He believes that this all started around 0930 on 03/17/2012. He described this as being "foggy". His wife was in the room and stated he has no previous memory difficulties. He had a right temporal headache. He has a history of migraines years ago, but none recently and this is unlike them. He did have a viral syndrome with vomiting about 3 weeks ago and described a chest pain at that time.  He did have palpitations years ago, saw Dr. Verdis Prime. He cut out his caffeine, decreased his stress level and they went away. He stated that a couple of weeks ago that he did have a "few seconds" of palpitations that he had not had since seeing Dr. Katrinka Blazing.   Date last known well: 03/17/2012  Time last known well: 0930  NIHSS=0  tPA Given: No: symptoms resolved. NIHSS=0.  SUBJECTIVE There are no family members in the room at this time The patient feels he is back to baseline today. He did note that he had a right frontal headache yesterday which included his right eye. He is anxious for discharge.   OBJECTIVE Most recent Vital Signs: Filed Vitals:   03/18/12 0334 03/18/12 0520 03/18/12 0643 03/18/12 1100  BP: 96/61 89/53 98/72  132/76  Pulse: 58 57  62  Temp: 97.9 F (36.6 C) 98.4 F (36.9 C)  97.8 F (36.6 C)  TempSrc: Oral Oral    Resp: 20 20  20   Height:      Weight:      SpO2: 99% 98%  100%   CBG (last 3)   Recent Labs  03/17/12 1139  GLUCAP 92    IV Fluid Intake:     MEDICATIONS  . aspirin  300 mg Rectal Daily   Or  . aspirin  325 mg Oral Daily  . atorvastatin  20 mg Oral q1800  . cholecalciferol  2,000 Units Oral Q supper  . heparin subcutaneous  5,000 Units Subcutaneous Q8H   PRN:  acetaminophen, ondansetron (ZOFRAN) IV, senna-docusate  Diet:  Cardiac thin liquids Activity:  Up with assistance DVT Prophylaxis:  Subcutaneous heparin.  CLINICALLY SIGNIFICANT STUDIES Basic Metabolic Panel:  Recent Labs Lab 03/17/12 1147  NA 139  K 4.5  CL 100  CO2 30  GLUCOSE 102*  BUN 15  CREATININE 1.03  CALCIUM 10.0   Liver Function Tests:  Recent Labs Lab 03/17/12 1147  AST 28  ALT 34  ALKPHOS 48  BILITOT 0.3  PROT 7.1  ALBUMIN 4.4   CBC:  Recent Labs Lab 03/17/12 1147  WBC 5.7  HGB 15.1  HCT 43.8  MCV 87.1  PLT 163   Coagulation:  Recent Labs Lab 03/17/12 1147  LABPROT 12.9  INR 0.98   Cardiac Enzymes:  Recent Labs Lab 03/17/12 1526  TROPONINI <0.30   Urinalysis:  Recent Labs Lab 03/17/12 1213  COLORURINE YELLOW  LABSPEC 1.007  PHURINE 6.5  GLUCOSEU NEGATIVE  HGBUR NEGATIVE  BILIRUBINUR NEGATIVE  KETONESUR NEGATIVE  PROTEINUR NEGATIVE  UROBILINOGEN 0.2  NITRITE NEGATIVE  LEUKOCYTESUR NEGATIVE   Lipid Panel    Component Value Date/Time   CHOL 176 03/18/2012 0535   TRIG 84 03/18/2012 0535   HDL  51 03/18/2012 0535   CHOLHDL 3.5 03/18/2012 0535   VLDL 17 03/18/2012 0535   LDLCALC 108* 03/18/2012 0535   HgbA1C  No results found for this basename: HGBA1C    Urine Drug Screen:     Component Value Date/Time   LABOPIA NONE DETECTED 03/18/2012 0640   COCAINSCRNUR NONE DETECTED 03/18/2012 0640   LABBENZ NONE DETECTED 03/18/2012 0640   AMPHETMU NONE DETECTED 03/18/2012 0640   THCU NONE DETECTED 03/18/2012 0640   LABBARB NONE DETECTED 03/18/2012 0640    Alcohol Level: No results found for this basename: ETH,  in the last 168 hours  Dg Chest 2 View 03/17/12 IMPRESSION: No active cardiopulmonary process.   Ct Head Wo Contrast (only If Suspected Head Trauma And/or Pt Is On Anticoagulant) 03/17/12 IMPRESSION: No acute intracranial abnormality.       Mr Brain Wo Contrast 03/17/12 IMPRESSION: Tiny area of restricted motion medial left hippocampus  most suggestive of tiny acute infarct.  Tiny area of blood breakdown products posterior right frontal lobe. Question result of prior trauma, prior ischemia or tiny cavernoma.      MRA of the brain    2D Echocardiogram  pending  Carotid Doppler Preliminary report: There is <40% right ICA stenosis, distally. There is no left ICA stenosis. Bilateral vertebral artery flow is antegrade.  EKG  sinus rhythm rate 65 beats per minute  Therapy Recommendations pending  Physical Exam   General - 52 year old male in bed in no acute distress. Heart - Regular rate and rhythm - no murmer Lungs - Clear to auscultation Abdomen - Soft - non tender Extremities - Distal pulses intact - no edema Skin - Warm and dry  NEUROLOGIC:   MENTAL STATUS: awake, alert, Language fluent Follows simple commands. Oriented x3. CRANIAL NERVES: pupils equal and reactive to light,extraocular muscles intact, facial sensation and strength symmetric, uvula midlinec, tongue midline MOTOR: normal bulk and tone, strength 5 over 5 throughout. SENSORY: normal and symmetric to light touch  COORDINATION: finger-nose-finger normal - heel to shin normal   ASSESSMENT Mr. Cody Hall is a 52 y.o. male presenting with altered mental status. TPA was not given secondary to resolution of symptoms. MRI confirms a tiny area of restricted motion in the medial left hippocampus most suggestive of tiny acute infarct.  Tiny area of blood breakdown products posterior right frontal lobe. Question result of prior trauma, prior ischemia or tiny cavernoma.   On no anticoagulation as prior to admission. Now on aspirin 325 mg daily for secondary stroke prevention.  Patient with resultant resolution of symptoms. Work up underway.   LDL - 108 - on Lipitor  Hemoglobin A1c pending  Palpitations  Hospital day # 1  TREATMENT/PLAN  Continue aspirin 325 mg daily.  Await 2-D echo  Await therapists evaluations  Monitor telemetry for  possible paroxysmal atrial fibrillation.  Recommend hypercoagulable panel.  Pt would like a B12 level  Consider discharge once studies completed  F/U Dr Pearlean Brownie 1 -2 months   Delton See PA-C [facility] Pager [phone] 03/18/2012, 11:09 AM  I have personally obtained a history, examined the patient, evaluated imaging results, and formulated the assessment and plan of care. I agree with the above.  Await results of 2dEcho/carotid doppler Asa 325 daily/statin HbA1c/lipid panel.  Hypercoagulable profile prior to d/c which can be followed up as out pt.  Suspect pt can be d/c as he states he is back to baseline.   F/u Neurology clinic as out pt.   Annalynne Ibanez,  Doyle Askew

## 2012-03-18 NOTE — Progress Notes (Signed)
Seen and examined.  Discussed with Dr. Adriana Simas.  Agree with his management.

## 2012-03-18 NOTE — Progress Notes (Signed)
  Echocardiogram 2D Echocardiogram has been performed.  Montanna Mcbain FRANCES 03/18/2012, 11:46 AM

## 2012-03-18 NOTE — Evaluation (Signed)
Physical Therapy Evaluation Patient Details Name: Cody Hall MRN: [id number] DOB: [birth year] Today's Date: 03/18/2012 Time: 5284-1324 PT Time Calculation (min): 19 min  PT Assessment / Plan / Recommendation Clinical Impression  Pt is a 52 y.o. male s/p hippocamus infarct.  Patient provided information on stroke prevention. Patient independent for all activities and cognition appears to have improved.  No acute PT needs, PT will sign off.    PT Assessment  Patent does not need any further PT services    Follow Up Recommendations  No PT follow up          Equipment Recommendations  None recommended by PT          Precautions / Restrictions     Pertinent Vitals/Pain No pain      Mobility  Bed Mobility Bed Mobility: Supine to Sit;Sitting - Scoot to Edge of Bed Supine to Sit: 7: Independent Sitting - Scoot to [employer] of Bed: 7: Independent Transfers Transfers: Sit to Stand;Stand to Sit Sit to Stand: 7: Independent Stand to Sit: 7: Independent Ambulation/Gait Ambulation/Gait Assistance: 7: Independent Ambulation Distance (Feet): 400 Feet Assistive device: None Ambulation/Gait Assistance Details: Cognitive tasks while ambulating; no difficulties Gait Pattern: Within Functional Limits Gait velocity: WNL General Gait Details: Pt steady WNL Modified Rankin (Stroke Patients Only) Pre-Morbid Rankin Score: No symptoms Modified Rankin: No symptoms    Exercises Other Exercises Other Exercises: cognitive exercises; giving directions to various locations; able to navigate Unit to find assigned rooms; able to recite higher level information (was not able to do this yesterday per patient)     Visit Information  Last PT Received On: 03/18/12 Assistance Needed: +1    Subjective Data  Subjective: I feel like i'm back to my normal today Patient Stated Goal: to go home   Prior Functioning  Home Living Lives With: Family Available Help at Discharge: Family;Available  PRN/intermittently Type of Home: House Home Access: Stairs to enter [employer] of Steps: 5 Entrance Stairs-Rails: Right Home Layout: Two level;Bed/bath upstairs Alternate Level Stairs-Number of Steps: 12 Alternate Level Stairs-Rails: Right Bathroom Shower/Tub: Engineer, manufacturing systems: Standard Home Adaptive Equipment: None Prior Function Level of Independence: Independent Able to Take Stairs?: Yes Driving: Yes Vocation: Full time employment    Cognition  Cognition Overall Cognitive Status: Appears within functional limits for tasks assessed/performed Arousal/Alertness: Awake/alert Orientation Level: Appears intact for tasks assessed;Oriented X4 / Intact Behavior During Session: [employer] for tasks performed    Extremity/Trunk Assessment Right Upper Extremity Assessment RUE ROM/Strength/Tone: Within functional levels Left Upper Extremity Assessment LUE ROM/Strength/Tone: Within functional levels Right Lower Extremity Assessment RLE ROM/Strength/Tone: Within functional levels Left Lower Extremity Assessment LLE ROM/Strength/Tone: Within functional levels   Balance Balance Balance Assessed: Yes High Level Balance High Level Balance Activites: Side stepping;Backward walking;Direction changes;Turns;Sudden stops;Head turns High Level Balance Comments: Independent with activites  End of Session PT - End of Session Activity Tolerance: Patient tolerated treatment well Patient left: in chair;with call bell/phone within reach Nurse Communication: Mobility status (pt independent and safe for d/c )  GP     Fabio Asa 03/18/2012, 2:36 PM  Charlotte Crumb, PT DPT  [phone]

## 2012-03-19 LAB — HEMOGLOBIN A1C: Hgb A1c MFr Bld: 5.2 % (ref <5.7)

## 2012-03-19 NOTE — Discharge Summary (Signed)
Seen and examined on the day of DC.  Agree with DC as outlined by Dr. Cook. 

## 2012-03-20 LAB — PROTEIN S, TOTAL: Protein S Ag, Total: 262 % — ABNORMAL HIGH (ref 60–150)

## 2012-03-20 LAB — PROTEIN C, TOTAL: Protein C, Total: 94 % (ref 72–160)

## 2012-03-21 ENCOUNTER — Telehealth: Payer: Self-pay | Admitting: Emergency Medicine

## 2012-03-21 LAB — BETA-2-GLYCOPROTEIN I ABS, IGG/M/A
Beta-2 Glyco I IgG: 0 G Units (ref <20)
Beta-2-Glycoprotein I IgM: 0 M Units (ref <20)

## 2012-03-21 LAB — LUPUS ANTICOAGULANT PANEL
DRVVT: 37.6 secs (ref <42.9)
Lupus Anticoagulant: NOT DETECTED

## 2012-03-21 LAB — PROTEIN C ACTIVITY: Protein C Activity: 185 % — ABNORMAL HIGH (ref 75–133)

## 2012-03-21 LAB — CARDIOLIPIN ANTIBODIES, IGG, IGM, IGA: Anticardiolipin IgA: 0 APL U/mL — ABNORMAL LOW (ref <22)

## 2012-03-21 NOTE — ED Provider Notes (Signed)
History     CSN: [id number]  Arrival date & time 03/17/12  1133   First MD Initiated Contact with Patient 03/17/12 1150      Chief Complaint  Patient presents with  . Altered Mental Status  . Headache    (Consider location/radiation/quality/duration/timing/severity/associated sxs/prior treatment) Patient is a 52 y.o. male presenting with altered mental status and headaches. The history is provided by the patient.  Altered Mental Status This is a new problem. Associated symptoms include headaches. Pertinent negatives include no chest pain, no abdominal pain and no shortness of breath.  Headache Associated symptoms: no abdominal pain, no back pain, no diarrhea, no pain, no nausea, no neck stiffness, no numbness and no vomiting    patient presented with memory issues and confusion. 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 Had difficulty remembering where the hospital was. Mild dull right-sided headache. No localizing numbness or weakness. Has not had episodes like this before. He been confused most of the day. It is unknown the exact time of onset. No previous strokes.  Past Medical History  Diagnosis Date  . Low testosterone   . Other malaise and fatigue   . Bronchospasm   . Seasonal allergies   . Reflux   . Tic     FACIAL  . Elevated cholesterol     LDL  . Chronic constipation   . GERD (gastroesophageal reflux disease)     Past Surgical History  Procedure Laterality Date  . Appendectomy      Family History  Problem Relation Age of Onset  . Colon cancer Neg Hx   . Colon polyps Neg Hx   . Ovarian cancer Mother     History  Substance Use Topics  . Smoking status: Never Smoker   . Smokeless tobacco: Never Used  . Alcohol Use: No      Review of Systems  Constitutional: Negative for activity change and appetite change.  HENT: Negative for neck stiffness.   Eyes: Negative for pain.  Respiratory: Negative for  chest tightness and shortness of breath.   Cardiovascular: Negative for chest pain and leg swelling.  Gastrointestinal: Negative for nausea, vomiting, abdominal pain and diarrhea.  Genitourinary: Negative for flank pain.  Musculoskeletal: Negative for back pain.  Skin: Negative for rash.  Neurological: Positive for headaches. Negative for weakness and numbness.  Psychiatric/Behavioral: Positive for altered mental status. Negative for behavioral problems.    Allergies  Flexeril; Meloxicam; and Penicillins  Home Medications   Current Outpatient Rx  Name  Route  Sig  Dispense  Refill  . Cholecalciferol (VITAMIN D3) 2000 UNITS TABS   Oral   Take 1 tablet by mouth at bedtime.          Marland Kitchen DHEA 25 MG CAPS   Oral   Take 1 capsule by mouth.         . testosterone (ANDROGEL) 50 MG/5GM GEL   Transdermal   Place 5 g onto the skin daily. Needs office visit before out   30 Package   5   . aspirin 325 MG tablet   Oral   Take 1 tablet (325 mg total) by mouth daily.         Marland Kitchen atorvastatin (LIPITOR) 20 MG tablet   Oral   Take 1 tablet (20 mg total) by mouth daily at 6 PM.   30 tablet   0     BP 115/70  Pulse 59  Temp(Src) 98.1 F (36.7 C) (Oral)  Resp 20  Ht 5\' 8"  (1.727 m)  Wt 155 lb 6.4 oz (70.489 kg)  BMI 23.63 kg/m2  SpO2 99%  Physical Exam  Nursing note and vitals reviewed. Constitutional: He is oriented to person, place, and time. He appears well-developed and well-nourished.  HENT:  Head: Normocephalic and atraumatic.  Eyes: EOM are normal. Pupils are equal, round, and reactive to light.  Neck: Normal range of motion. Neck supple.  Cardiovascular: Normal rate, regular rhythm and normal heart sounds.   No murmur heard. Pulmonary/Chest: Effort normal and breath sounds normal.  Abdominal: Soft. Bowel sounds are normal. He exhibits no distension and no mass. There is no tenderness. There is no rebound and no guarding.  Musculoskeletal: Normal range of motion. He  exhibits no edema.  Neurological: He is alert and oriented to person, place, and time. No cranial nerve deficit.  Patient is mildly slow to answer questions. No lateralizing symptoms. He is having difficulty naming people on his posterior route, he states he normally would not have problems with that.  Skin: Skin is warm and dry.  Psychiatric: He has a normal mood and affect.    ED Course  Procedures (including critical care time)  Labs Reviewed  COMPREHENSIVE METABOLIC PANEL - Abnormal; Notable for the following:    Glucose, Bld 102 (*)    GFR calc non Af Amer 82 (*)    All other components within normal limits  LIPID PANEL - Abnormal; Notable for the following:    LDL Cholesterol 108 (*)    All other components within normal limits  PROTEIN S, TOTAL - Abnormal; Notable for the following:    Protein S Ag, Total 262 (*)    All other components within normal limits  GLUCOSE, CAPILLARY  CBC  PROTIME-INR  URINALYSIS, ROUTINE W REFLEX MICROSCOPIC  URINE RAPID DRUG SCREEN (HOSP PERFORMED)  TROPONIN I  HEMOGLOBIN A1C  URINE RAPID DRUG SCREEN (HOSP PERFORMED)  TSH  ANTITHROMBIN III  PROTEIN C, TOTAL  BETA-2-GLYCOPROTEIN I ABS, IGG/M/A  HOMOCYSTEINE  PROTEIN C ACTIVITY  PROTEIN S ACTIVITY  LUPUS ANTICOAGULANT PANEL  FACTOR 5 LEIDEN  CARDIOLIPIN ANTIBODIES, IGG, IGM, IGA   No results found.   1. Stroke   2. Amnesia   3. CVA (cerebral infarction)   4. Hyperlipidemia LDL goal < 100   5. Testosterone deficiency   6. Reflux     Date: 03/21/2012  Rate: 65  Rhythm: normal sinus rhythm  QRS Axis: normal  Intervals: PR prolonged  ST/T Wave abnormalities: normal  Conduction Disutrbances:none  Narrative Interpretation:   Old EKG Reviewed: none available     MDM  Patient presents with memory issues. Also a dull headache. CT done was negative. After discussion with neurology an MRI was obtained. It showed a likely stroke. [facility]  further evaluation treatment. He was not a TPA candidate due 2 time of onset and severity of disease.        Juliet Rude. Rubin Payor, MD 03/21/12 1116

## 2012-03-21 NOTE — Telephone Encounter (Signed)
Message left on answering machine just letting the patient know I hope he has a rapid recovery.

## 2012-03-22 ENCOUNTER — Encounter: Payer: Self-pay | Admitting: Neurology

## 2012-03-22 ENCOUNTER — Other Ambulatory Visit: Payer: Self-pay | Admitting: *Deleted

## 2012-03-22 ENCOUNTER — Ambulatory Visit (INDEPENDENT_AMBULATORY_CARE_PROVIDER_SITE_OTHER): Payer: Federal, State, Local not specified - PPO

## 2012-03-22 ENCOUNTER — Ambulatory Visit (INDEPENDENT_AMBULATORY_CARE_PROVIDER_SITE_OTHER): Payer: Federal, State, Local not specified - PPO | Admitting: Emergency Medicine

## 2012-03-22 VITALS — BP 100/72 | HR 50 | Temp 97.9°F | Resp 16 | Ht 68.0 in | Wt 154.0 lb

## 2012-03-22 DIAGNOSIS — R002 Palpitations: Secondary | ICD-10-CM

## 2012-03-22 DIAGNOSIS — R0989 Other specified symptoms and signs involving the circulatory and respiratory systems: Secondary | ICD-10-CM

## 2012-03-22 DIAGNOSIS — D6851 Activated protein C resistance: Secondary | ICD-10-CM

## 2012-03-22 DIAGNOSIS — I6350 Cerebral infarction due to unspecified occlusion or stenosis of unspecified cerebral artery: Secondary | ICD-10-CM

## 2012-03-22 DIAGNOSIS — I639 Cerebral infarction, unspecified: Secondary | ICD-10-CM

## 2012-03-22 NOTE — Progress Notes (Signed)
  Subjective:    Patient ID: Cody Hall, male    DOB: [birth year], 51 y.o.   MRN: [id number]  HPI patient in for followup recent hospitalization. He was hospitalized with a clot in the hippocampus. His presenting symptoms were confusion and cognitive dysfunction. He never had any issues with weakness sensory changes or speech problems. He was discharged on aspirin and Lipitor. He had testing done in the hospital and tested positive heterozygous for Leiden factor V mutation. He has a followup with Dr. Pearlean Brownie in 6 weeks. Apparently he is also to have a Holter monitor placed and Dr. Marlis Edelson office. He developed some discomfort in his legs and back and chest and he feels this was related to Lipitor so he has since stopped this.    Review of Systems     Objective:   Physical Exam patient is alert and cooperative. His cranial nerves are intact. Carotids are normal without bruits. Cardiac exam is regular rate no murmurs. Neurological exam patient is alert cooperative and appropriate but at times has some short memory lapses. There are no focal cranial nerve signs there is no weakness in the upper or lower extremities.  EKG normal sinus rhythm sinus bradycardia rate 50     Assessment & Plan:  Patient is to followup with Dr. Excell Seltzer regarding his complaints of palpitations. He did have an echo done in the hospital. Patient did test positive for Leiden factor V mutation. Protocol and Dr. Marlis Edelson office to see if aspirin along with enough for his anticoagulation to he has an appointment to see Dr. Pearlean Brownie in 6 weeks. He is to continue off his Lipitor for now because of his back and hip discomfort .

## 2012-03-23 LAB — TESTOSTERONE, FREE, TOTAL, SHBG
Sex Hormone Binding: 47 nmol/L (ref 13–71)
Testosterone, Free: 31.6 pg/mL — ABNORMAL LOW (ref 47.0–244.0)
Testosterone-% Free: 1.5 % — ABNORMAL LOW (ref 1.6–2.9)
Testosterone: 208 ng/dL — ABNORMAL LOW (ref 300–890)

## 2012-03-23 NOTE — Progress Notes (Signed)
Enrolled patient for monitor to be mailed to their home 

## 2012-03-27 ENCOUNTER — Encounter: Payer: Self-pay | Admitting: Emergency Medicine

## 2012-03-27 DIAGNOSIS — Z0271 Encounter for disability determination: Secondary | ICD-10-CM

## 2012-03-30 ENCOUNTER — Ambulatory Visit (INDEPENDENT_AMBULATORY_CARE_PROVIDER_SITE_OTHER): Payer: Federal, State, Local not specified - PPO

## 2012-03-30 DIAGNOSIS — I4891 Unspecified atrial fibrillation: Secondary | ICD-10-CM

## 2012-03-30 DIAGNOSIS — R0989 Other specified symptoms and signs involving the circulatory and respiratory systems: Secondary | ICD-10-CM

## 2012-03-31 ENCOUNTER — Encounter: Payer: Self-pay | Admitting: Emergency Medicine

## 2012-03-31 ENCOUNTER — Ambulatory Visit: Payer: Federal, State, Local not specified - PPO

## 2012-03-31 ENCOUNTER — Ambulatory Visit (INDEPENDENT_AMBULATORY_CARE_PROVIDER_SITE_OTHER): Payer: Federal, State, Local not specified - PPO | Admitting: Emergency Medicine

## 2012-03-31 VITALS — BP 120/72 | HR 55 | Temp 97.8°F | Resp 16 | Ht 67.0 in | Wt 151.0 lb

## 2012-03-31 DIAGNOSIS — R0602 Shortness of breath: Secondary | ICD-10-CM

## 2012-03-31 DIAGNOSIS — I639 Cerebral infarction, unspecified: Secondary | ICD-10-CM

## 2012-03-31 DIAGNOSIS — I6350 Cerebral infarction due to unspecified occlusion or stenosis of unspecified cerebral artery: Secondary | ICD-10-CM

## 2012-03-31 DIAGNOSIS — M25569 Pain in unspecified knee: Secondary | ICD-10-CM

## 2012-03-31 DIAGNOSIS — M25562 Pain in left knee: Secondary | ICD-10-CM

## 2012-03-31 NOTE — Progress Notes (Signed)
  Subjective:    Patient ID: Cody Hall, male    DOB: [birth year], 52 y.o.   MRN: [id number]  HPI Pt presents to clinic today with his wife for hospital follow up. He started Lipitor on Saturday in the hospital and took it for 3 days. He feels that he was having side effects from this. He started having shortness of breath and "pressure in his heart". The symptoms started on Monday. He was also having muscle aches. After he stopped taking Lipitor, the SOB and pressure subsided after a few days. Today he feels good. He also has some discomfort behind his left knee but no swelling of his calf or leg    Review of Systems     Objective:   Physical Exam patient is alert and cooperative. There are no focal neurological signs his chest is clear. Heart regular rate no murmurs. EKG shows a sinus bradycardia with first-degree block. He does have a monitor on.  [facility] reading (PRIMARY) by  Dr.Lequita Meadowcroft chest x-ray shows a artifact right anterior chest from a cardiac lead otherwise within normal limits.         Assessment & Plan:  Patient is scheduled to see the cardiologist on the second. He has a followup appointment with the neurologist the end of the month. If he is cleared by the cardiologist and his mentation has cleared I could consider releasing him to return to work . He was complaining of some discomfort in his left popliteal area but the exam of that area is normal but since he carries Leiden factor V mutation we'll go ahead and ultrasound the left leg.

## 2012-03-31 NOTE — Progress Notes (Signed)
ENROLLED PATIENT FOR MONITOR TO BE MAILED

## 2012-04-03 ENCOUNTER — Telehealth: Payer: Self-pay | Admitting: Cardiovascular Disease

## 2012-04-03 NOTE — Telephone Encounter (Signed)
New problem    Per pt he doesn't want to deal w/the company([employer]) he's currently dealing w/regarding his monitor and wants to know what he needs to do-because he wants to send the one from [employer] back but wants to know how to go about getting another monitor

## 2012-04-04 ENCOUNTER — Telehealth: Payer: Self-pay

## 2012-04-04 NOTE — Telephone Encounter (Signed)
APPOINTMENT

## 2012-04-07 ENCOUNTER — Telehealth: Payer: Self-pay | Admitting: Radiology

## 2012-04-07 NOTE — Telephone Encounter (Signed)
You asked about a referral, what do you need me to do?

## 2012-04-10 ENCOUNTER — Ambulatory Visit
Admission: RE | Admit: 2012-04-10 | Discharge: 2012-04-10 | Disposition: A | Discharge status: Home / Self Care | Payer: Federal, State, Local not specified - PPO | Source: Ambulatory Visit | Attending: Emergency Medicine | Admitting: Emergency Medicine

## 2012-04-10 ENCOUNTER — Ambulatory Visit (INDEPENDENT_AMBULATORY_CARE_PROVIDER_SITE_OTHER): Payer: Federal, State, Local not specified - PPO

## 2012-04-10 ENCOUNTER — Other Ambulatory Visit: Payer: Self-pay | Admitting: Radiology

## 2012-04-10 DIAGNOSIS — M79662 Pain in left lower leg: Secondary | ICD-10-CM

## 2012-04-10 DIAGNOSIS — R002 Palpitations: Secondary | ICD-10-CM

## 2012-04-10 NOTE — Progress Notes (Signed)
Placed a 30 day event monitor on patient and went over instructions on how to use it and when to return it 

## 2012-04-11 ENCOUNTER — Ambulatory Visit: Payer: Federal, State, Local not specified - PPO | Admitting: Cardiovascular Disease

## 2012-04-12 ENCOUNTER — Encounter: Payer: Self-pay | Admitting: Cardiovascular Disease

## 2012-04-12 ENCOUNTER — Ambulatory Visit (INDEPENDENT_AMBULATORY_CARE_PROVIDER_SITE_OTHER): Payer: Federal, State, Local not specified - PPO | Admitting: Cardiovascular Disease

## 2012-04-12 VITALS — BP 112/72 | HR 64 | Ht 67.0 in | Wt 151.0 lb

## 2012-04-12 DIAGNOSIS — I639 Cerebral infarction, unspecified: Secondary | ICD-10-CM

## 2012-04-12 DIAGNOSIS — R002 Palpitations: Secondary | ICD-10-CM

## 2012-04-12 DIAGNOSIS — I6350 Cerebral infarction due to unspecified occlusion or stenosis of unspecified cerebral artery: Secondary | ICD-10-CM

## 2012-04-12 NOTE — Patient Instructions (Addendum)
Your physician recommends that you schedule a follow-up appointment as needed.   Your physician recommends that you continue on your current medications as directed. Please refer to the Current Medication list given to you today.  

## 2012-04-13 ENCOUNTER — Encounter: Payer: Self-pay | Admitting: Cardiovascular Disease

## 2012-04-13 NOTE — Progress Notes (Signed)
HPI:  52 year old previously healthy gentleman presenting for cardiac evaluation. He was hospitalized March 7 with an acute episode of confusion and amnesia. He was found to have an acute hippocampal stroke. He was treated with aspirin. An echocardiogram was normal. A hypercoagulable panel was obtained. He is a heterozygote for factor V  Leiden. His homocystine and anticardiolipin antibodies were normal/negative. Antithrombin III was normal. Protein C level was normal but protein C activity was elevated. Protein S. antigen was elevated. There were no protein deficiency is noted.  The patient has had palpitations. He is wearing an event recorder. He also complains of fatigue. He's had no exertional chest pain or pressure. He's had headache located in the right retro-orbital region that feels like a pressure. He's had no visual changes. He denies any focal weakness or numbness of his extremities.  He's been very active. 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 He is eagerly awaiting his return to work.  He has no history of cardiac disease. He specifically denies chest pain or pressure with activity. He denies dyspnea, orthopnea, PND, lightheadedness, or syncope. He had remote palpitations associated with high stress. He wore a monitor about 20 years ago. His symptoms resolved with decreased caffeine.   Outpatient Encounter Prescriptions as of 04/12/2012  Medication Sig Dispense Refill  . aspirin 325 MG tablet Take 1 tablet (325 mg total) by mouth daily.      Marland Kitchen atorvastatin (LIPITOR) 20 MG tablet Take 1 tablet (20 mg total) by mouth daily at 6 PM.  30 tablet  0  . Cholecalciferol (VITAMIN D3) 2000 UNITS TABS Take 1 tablet by mouth at bedtime.       Marland Kitchen DHEA 25 MG CAPS Take 1 capsule by mouth.      . testosterone (ANDROGEL) 50 MG/5GM GEL Place 5 g onto the skin daily. Needs office visit before out  30 Package  5   No facility-administered encounter medications on file as of 04/12/2012.     Flexeril; Meloxicam; and Penicillins  Past Medical History  Diagnosis Date  . Low testosterone   . Other malaise and fatigue   . Bronchospasm   . Seasonal allergies   . Reflux   . Tic     FACIAL  . Elevated cholesterol     LDL  . Chronic constipation   . GERD (gastroesophageal reflux disease)     Past Surgical History  Procedure Laterality Date  . Appendectomy      History   Social History  . Marital Status: Married    Spouse Name: N/A    Number of Children: N/A  . Years of Education: N/A   Occupational History  . Not on file.   Social History Main Topics  . Smoking status: Never Smoker   . Smokeless tobacco: Never Used  . Alcohol Use: No  . Drug Use: No  . Sexually Active: Not on file   Other Topics Concern  . Not on file   Social History Narrative   EXERCISE HAND [employer]    Family History  Problem Relation Age of Onset  . Colon cancer Neg Hx   . Colon polyps Neg Hx   . Ovarian cancer Mother   . Hyperlipidemia Father   . Hyperlipidemia Sister     ROS:  General: no fevers/chills/night sweats Eyes: no blurry vision, diplopia, or amaurosis ENT: no sore throat or hearing loss Resp: no cough, wheezing, or hemoptysis CV: See history of present illness GI: no abdominal  pain, nausea, vomiting, diarrhea. Positive for constipation  GU: no dysuria, frequency, or hematuria Skin: no rash Neuro: See history of present illness Musculoskeletal: no joint pain or swelling Heme: no bleeding, DVT, or easy bruising Endo: no polydipsia or polyuria  BP 112/72  Pulse 64  Ht 5\' 7"  (1.702 m)  Wt 68.493 kg (151 lb)  BMI 23.64 kg/m2  PHYSICAL EXAM: Pt is alert and oriented, WD, WN, in no distress. HEENT: normal Neck: JVP normal. Carotid upstrokes normal without bruits. No thyromegaly. Lungs: equal expansion, clear bilaterally CV: Apex is discrete and nondisplaced, RRR without murmur or gallop Abd: soft, NT, +BS, no bruit, no hepatosplenomegaly Back: no  CVA tenderness Ext: no C/C/E        Femoral pulses 2+= without bruits        DP/PT pulses intact and = Skin: warm and dry without rash Neuro: CNII-XII intact             Strength intact = bilaterally  EKG:  Normal sinus rhythm 59 beats per minute, within normal limits.  2-D echo: Left ventricle: The cavity size was normal. Systolic function was normal. The estimated ejection fraction was in the range of 55% to 60%. Wall motion was normal; there were no regional wall motion abnormalities.  ------------------------------------------------------------ Aortic valve: Trileaflet; normal thickness leaflets. Mobility was not restricted. Doppler: Transvalvular velocity was within the normal range. There was no stenosis. No regurgitation.  ------------------------------------------------------------ Aorta: Aortic root: The aortic root was normal in size.  ------------------------------------------------------------ Mitral valve: Structurally normal valve. Mobility was not restricted. Doppler: Transvalvular velocity was within the normal range. There was no evidence for stenosis. Trivial regurgitation.  ------------------------------------------------------------ Left atrium: The atrium was normal in size.  ------------------------------------------------------------ Right ventricle: The cavity size was normal. Wall thickness was normal. Systolic function was normal.  ------------------------------------------------------------ Pulmonic valve: Not well visualized. The valve appears to be grossly normal. Doppler: Transvalvular velocity was within the normal range. There was no evidence for stenosis. Trivial regurgitation.  ------------------------------------------------------------ Tricuspid valve: Structurally normal valve. Doppler: Transvalvular velocity was within the normal range. No regurgitation.  ------------------------------------------------------------ Right atrium:  The atrium was normal in size.  ------------------------------------------------------------ Pericardium: There was no pericardial effusion.  ------------------------------------------------------------ Systemic veins: Inferior vena cava: The vessel was normal in size.  ------------------------------------------------------------  2D measurements Normal Doppler measurements Normal Left ventricle Left ventricle LVID ED, 41.5 mm 43-52 Ea, lat ann, 9.4 cm/s ------ chord, tiss DP 3 PLAX E/Ea, lat 7.3 ------ LVID ES, 28.6 mm 23-38 ann, tiss DP 3 chord, Ea, med ann, 9.7 cm/s ------ PLAX tiss DP 6 FS, chord, 31 % >29 E/Ea, med 7.0 ------ PLAX ann, tiss DP 8 LVPW, ED 7.9 mm ------ Mitral valve IVS/LVPW 0.99 <1.3 Peak E vel 69. cm/s ------ ratio, ED 1 Ventricular septum Peak A vel 42. cm/s ------ IVS, ED 7.79 mm ------ 9 Aorta Deceleration 176 ms 150-23 Root diam, 31 mm ------ time 0 ED Peak E/A 1.6 ------ Left atrium ratio AP dim 34 mm ------ Right ventricle AP dim 1.84 cm/m^2 <2.2 Sa vel, lat 12. cm/s ------ index ann, tiss DP 5  ASSESSMENT AND PLAN: 1. Cryptogenic stroke. No cardiac disease based on surface echo. He is on appropriate medical therapy. The patient cannot take a statin drug. He had profound weakness, diffuse muscle aches, and chest pains associated with his statin drug initiation. He was only able to take this for 3 days and has now stopped. He is tolerating antiplatelet therapy with aspirin. The patient is  heterozygous for factor V Leiden. I don't believe this is associated with arterial thrombotic events, but it does raise the possibility of venous thromboembolism/paradoxical embolism. He follows with Dr. Pearlean Brownie in the next few weeks. I suspect she will have a transcranial Doppler study and if this is positive will probably require a transesophageal echo. I will defer testing decisions to Dr. Pearlean Brownie since this is his area of expertise.  2. Palpitations. I suspect these  are benign. Will evaluate his event recorder when available.  3. Hyperlipidemia. Last lipid showed cholesterol of 176, LDL 108, and HDL 51. History glycerides are 84. He will continue with lifestyle modification. Could consider a non-statin agent such as zetia, but no strong evidence to support this.  Overall, I think he is recovering well from his event. I don't think he requires further cardiac testing unless there is evidence of intracardiac shunt from upcoming TCD study. I wait to hear from Dr. Pearlean Brownie in case he wants any other cardiac studies performed. I think the patient is stable to return to work.  Tonny Bollman 04/13/2012 2:02 PM

## 2012-04-18 ENCOUNTER — Ambulatory Visit (INDEPENDENT_AMBULATORY_CARE_PROVIDER_SITE_OTHER): Payer: Federal, State, Local not specified - PPO | Admitting: Emergency Medicine

## 2012-04-18 ENCOUNTER — Encounter: Payer: Self-pay | Admitting: Emergency Medicine

## 2012-04-18 VITALS — BP 112/70 | HR 54 | Temp 98.2°F | Resp 16 | Ht 67.5 in | Wt 149.6 lb

## 2012-04-18 DIAGNOSIS — I6990 Unspecified sequelae of unspecified cerebrovascular disease: Secondary | ICD-10-CM

## 2012-04-18 DIAGNOSIS — J309 Allergic rhinitis, unspecified: Secondary | ICD-10-CM

## 2012-04-18 DIAGNOSIS — E291 Testicular hypofunction: Secondary | ICD-10-CM

## 2012-04-18 MED ORDER — DESLORATADINE 5 MG PO TABS: 5.0000 mg | ORAL_TABLET | Freq: Every day | ORAL | Status: DC

## 2012-04-18 MED ORDER — TESTOSTERONE 50 MG/5GM (1%) TD GEL: 5.0000 g | Freq: Every day | TRANSDERMAL | Status: DC

## 2012-04-18 NOTE — Progress Notes (Signed)
  Subjective:    Patient ID: Cody Hall, male    DOB: [birth year], 52 y.o.   MRN: [id number]  HPIFollow up s/p hippocampal stroke. Currently wearing holter monitor for 30 days. Has neurology and cardiology follow ups scheduled soon. Seeing Dr. Pearlean Brownie.  Is currently not working. Feels like he is ready to go back to work. Would like to be able to go back specifically next Wednesday, so he only has to work for 4 days then have 4 days off. Feels like his mind has improved. Had one "cloudy day" last week, but it has cleared up. Would like to be able to drive again for work. 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 Only remaining symptoms have been dull headaches, which have resolved. Feels like he is anxious and depressed because he has not been working.    Review of Systems     Objective:   Physical Exam patient is alert and cooperative there are no focal neurological signs. I had him run up and down the hall without difficulty. He is wearing a Holter monitor at the present time        Assessment & Plan:  Patient tested positive for Leiden factor V. He has a heterozygous mutation. I initially gave him a prescription for of his testosterone and then called him and asked him to return it. I had a long discussion with the patient about the risks of testosterone replacement and clots and testing positive for Leiden factor V mutation. I had previously spoken with the neurologist who told me the type of stroke that he experienced is related to arterial emboli and is not related to Factor V mutation which is associated with venous clotting. The patient was adamant that he stay on testosterone in that he will not be able to work and will resume his fatigue if he is not on replacement. I told the patient he would need to stop the testosterone for now. I told him I would call the endocrinologist tomorrow and speak with them and getting in as soon as possible to help treat his low testosterone with something that would not  put him at risk for thromboembolism. He stated he was going to stay on the testosterone anyway. He stated that he is totally nonfunctional if he is not on the medication. He is scheduled to see Dr. Pearlean Brownie  later this month regarding his stroke. I asked his wife Cody Hall to return the prescription he was given. She said she would do this. I called [facility] and canceled his prescription for testosterone. I have put a call into Dr. Willeen Cass  office for her to return my call on my cell phone I did give patient a note to return to work next week. It's been very difficult for him at home not working.Marland Kitchen

## 2012-04-19 ENCOUNTER — Telehealth: Payer: Self-pay | Admitting: Endocrinology

## 2012-04-19 NOTE — Telephone Encounter (Signed)
Dr. Cleta Alberts would like to speak to you at your earliest convenience regarding this pt.  Thanks   Dr.Daub - [phone]

## 2012-04-19 NOTE — Telephone Encounter (Signed)
i called back.. We discussed

## 2012-04-21 ENCOUNTER — Telehealth: Payer: Self-pay | Admitting: Emergency Medicine

## 2012-04-21 ENCOUNTER — Ambulatory Visit: Payer: Federal, State, Local not specified - PPO | Admitting: Endocrinology

## 2012-04-21 NOTE — Telephone Encounter (Signed)
Patient called and RS to 05/01/12 FYI

## 2012-04-21 NOTE — Telephone Encounter (Signed)
Please call patient and find out how his appointment when with Dr. Everardo All.

## 2012-04-26 ENCOUNTER — Other Ambulatory Visit: Payer: Self-pay | Admitting: *Deleted

## 2012-04-27 ENCOUNTER — Telehealth: Payer: Self-pay | Admitting: Radiology

## 2012-04-27 NOTE — Telephone Encounter (Signed)
Dr Cleta Alberts would like to speak to Dr Pearlean Brownie regarding patient, patient has appt on 05/02/12. Dr Cleta Alberts call back number is [phone] or [phone].

## 2012-05-01 ENCOUNTER — Ambulatory Visit (INDEPENDENT_AMBULATORY_CARE_PROVIDER_SITE_OTHER): Payer: Federal, State, Local not specified - PPO | Admitting: Endocrinology

## 2012-05-01 ENCOUNTER — Encounter: Payer: Self-pay | Admitting: Endocrinology

## 2012-05-01 VITALS — BP 130/80 | HR 76 | Wt 147.0 lb

## 2012-05-01 DIAGNOSIS — D6859 Other primary thrombophilia: Secondary | ICD-10-CM

## 2012-05-01 DIAGNOSIS — D6851 Activated protein C resistance: Secondary | ICD-10-CM

## 2012-05-01 DIAGNOSIS — E291 Testicular hypofunction: Secondary | ICD-10-CM

## 2012-05-01 DIAGNOSIS — E349 Endocrine disorder, unspecified: Secondary | ICD-10-CM

## 2012-05-01 NOTE — Patient Instructions (Addendum)
Refer to a blood specialist.  you will receive a phone call, about a day and time for an appointment.    If you wanted to see what the cause of the low testosterone is, you would need to stay-off the testosterone for approximately 2 months.

## 2012-05-01 NOTE — Progress Notes (Signed)
Subjective:    Patient ID: Cody Hall, male    DOB: 1960-03-15, 52 y.o.   MRN: [id number]  HPI Pt states he had onset of puberty at the normal age.  He has 1 biological child, but for some reason, he was unable to father another.  He says his wife had normal eval for infertility, but pt was not evaluated.   He says he has dx'ed with moderate hypogonadism in approx 2006, when he had severely decreased strength throughout the body, and assoc ED sxs. He was rx'ed androgel, and sxs quickly improved.  This also initially normalized his testosterone level, but more recent test results were low in his opinion. However, sxs have stil been well-controlled.   Pt says he was recenty advised to d/c the androgel due to the dx of a hypercoaguable state.  However, he says he "cannot function" without the androgel.  He is extremely hesitant to d/c androgel. Past Medical History  Diagnosis Date  . Low testosterone   . Other malaise and fatigue   . Bronchospasm   . Seasonal allergies   . Reflux   . Tic     FACIAL  . Elevated cholesterol     LDL  . Chronic constipation   . GERD (gastroesophageal reflux disease)     Past Surgical History  Procedure Laterality Date  . Appendectomy      History   Social History  . Marital Status: Married    Spouse Name: N/A    Number of Children: N/A  . Years of Education: N/A   Occupational History  . Not on file.   Social History Main Topics  . Smoking status: Never Smoker   . Smokeless tobacco: Never Used  . Alcohol Use: No  . Drug Use: No  . Sexually Active: Not on file   Other Topics Concern  . Not on file   Social History Narrative   EXERCISE HAND WEIGHTS    Current Outpatient Prescriptions on File Prior to Visit  Medication Sig Dispense Refill  . ANDROGEL 50 MG/5GM GEL       . aspirin 325 MG tablet Take 1 tablet (325 mg total) by mouth daily.      . Cholecalciferol (VITAMIN D3) 2000 UNITS TABS Take 1 tablet by mouth at bedtime.        Marland Kitchen desloratadine (CLARINEX) 5 MG tablet Take 1 tablet (5 mg total) by mouth daily.  30 tablet  11  . DHEA 25 MG CAPS Take 1 capsule by mouth.      Marland Kitchen atorvastatin (LIPITOR) 20 MG tablet Take 1 tablet (20 mg total) by mouth daily at 6 PM.  30 tablet  0   No current facility-administered medications on file prior to visit.    Allergies  Allergen Reactions  . Flexeril (Cyclobenzaprine Hcl) Other (See Comments)    Unknown   . Lipitor (Atorvastatin)     Rash, muscle aches and chest pressure  . Meloxicam     AR   nasal congestion  . Penicillins     childhood    Family History  Problem Relation Age of Onset  . Colon cancer Neg Hx   . Colon polyps Neg Hx   . Ovarian cancer Mother   . Hyperlipidemia Father   . Hyperlipidemia Sister     BP 130/80  Pulse 76  Wt 147 lb (66.679 kg)  BMI 22.67 kg/m2  SpO2 98%    Review of Systems denies polyuria, numbness, weight change, decreased urinary stream,  gynecomastia, fever, easy bruising, sob, rash, blurry vision, and chest pain.  He reports emotional lability, rhinorrhea, and fatigue.  He has chronic headache.    Objective:   Physical Exam VS: see vs page GEN: no distress HEAD: head: no deformity eyes: no periorbital swelling, no proptosis external nose and ears are normal mouth: no lesion seen NECK: supple, thyroid is not enlarged CHEST WALL: no deformity LUNGS: clear to auscultation BREASTS:  No gynecomastia CV: reg rate and rhythm, no murmur ABD: abdomen is soft, nontender.  no hepatosplenomegaly.  not distended.  no hernia GENITALIA:  Normal male.   MUSCULOSKELETAL: muscle bulk and strength are grossly normal.  no obvious joint swelling.  gait is normal and steady EXTEMITIES: no deformity.  no edema PULSES: dorsalis pedis intact bilat.  no carotid bruit NEURO:  cn 2-12 grossly intact.   readily moves all 4's.  sensation is intact to touch on all 4's. SKIN:  Normal texture and temperature.  No rash or suspicious lesion is  visible.  Normal hair distribution. NODES:  None palpable at the neck PSYCH: alert, oriented x3.  Does not appear anxious nor depressed.   Lab Results  Component Value Date   TESTOSTERONE 208* 03/22/2012  (pt says this was after he had missed a few doses of androgel)    Assessment & Plan:  Hypogonadism, uncertain etiology Symptom complex of decreased strength and other sxs, better after he started taking the androgel.  It is not certain that these sxs are related to the hypogonadism. hypercoaguable state.  i don't know what risk he faces from normalization of his testosterone in this setting.  However, little if any risk would be worth taking, in view of his recent CVA.

## 2012-05-02 ENCOUNTER — Ambulatory Visit (INDEPENDENT_AMBULATORY_CARE_PROVIDER_SITE_OTHER): Payer: Federal, State, Local not specified - PPO | Admitting: Neurology

## 2012-05-02 ENCOUNTER — Encounter: Payer: Self-pay | Admitting: Neurology

## 2012-05-02 ENCOUNTER — Telehealth: Payer: Self-pay | Admitting: Oncology

## 2012-05-02 VITALS — BP 114/77 | HR 70 | Temp 98.4°F | Ht 68.0 in | Wt 147.0 lb

## 2012-05-02 DIAGNOSIS — F05 Delirium due to known physiological condition: Secondary | ICD-10-CM | POA: Insufficient documentation

## 2012-05-02 DIAGNOSIS — I6350 Cerebral infarction due to unspecified occlusion or stenosis of unspecified cerebral artery: Secondary | ICD-10-CM

## 2012-05-02 NOTE — Progress Notes (Signed)
[facility] [address] [place]. [place] [address] [phone]       OFFICE CONSULT NOTE  Mr. Story Vanvranken Date of Birth:  1960/03/24 Medical Record Number:  [id number]   Referring MD:  Lesle Chris, MD  Reason for Referral:  Stroke  HPI: Treyvin Glidden is an 52 y.o. 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 He did have a meeting just before this and issues were brought up that he did not recall from 2 weeks before. He believes that this all started around 0930 on 03/17/2012. He describes this as being "foggy". His wife  states he has no previous memory difficulties. He had right temporal headache. He has a history of migraines years ago, but none recently and this is unlike them. He did have a viral syndrome with vomiting about 3 weeks ago and describes a chest pain at that time.His headache and confusion cleared in a few hours. CT head was normal and MRI showed a small left medial temporal/hippocampal DWI positive/ADC dark lesion felt to be a small acute infarct. Carotid dopplers and transthoraxic echo were normal.Lipids were borderline with LDL 108 and he refused statins due to h/o intolerance in past. HbA1c was normal. He had hypercoagulable panel labs which were all normal except for factor 5 leiden heterozygous state. He has no h/o DVT, PE or any clots anywhere. There is no f/h/o strokes/MI in young age. He has no h/o seizures ar active migraine headaches. He had mild right temporal headche which resolved in  afew days after this evenmt and was not sever enough to need any medicines.  He did have palpitations years ago, saw Dr. Verdis Prime. He cut out his caffeine, decreased his stress level and they went away. He stated that a couple of weeks ago that he did have a "few seconds" of palpitations that he had not had since seeing Dr. Katrinka Blazing.   ROS:   14 system review of systems is  positive for headache and confusion only  PMH:  Past Medical History  Diagnosis Date  . Low testosterone   . Other malaise and fatigue   . Bronchospasm   . Seasonal allergies   . Reflux   . Tic     FACIAL  . Elevated cholesterol     LDL  . Chronic constipation   . GERD (gastroesophageal reflux disease)   . CVA (cerebral vascular accident)     Social History:  History   Social History  . Marital Status: Married    Spouse Name: Claris Che    Number of Children: 1  . Years of Education: college   Occupational History  .     Social History Main Topics  . Smoking status: Never Smoker   . Smokeless tobacco: Never Used  . Alcohol Use: No  . Drug Use: No  . Sexually Active: Not on file   Other Topics Concern  . Not on file   Social History Narrative   EXERCISE HAND WEIGHTS   Pt lives at home with his spouse and chid.    Medications:   Current Outpatient Prescriptions on File Prior to Visit  Medication Sig Dispense Refill  . ANDROGEL 50 MG/5GM GEL       . aspirin 325 MG tablet Take 1 tablet (325 mg total) by mouth daily.      . Cholecalciferol (VITAMIN D3) 2000 UNITS TABS Take 1 tablet by mouth  at bedtime.       Marland Kitchen desloratadine (CLARINEX) 5 MG tablet Take 1 tablet (5 mg total) by mouth daily.  30 tablet  11  . DHEA 25 MG CAPS Take 1 capsule by mouth.       No current facility-administered medications on file prior to visit.    Allergies:   Allergies  Allergen Reactions  . Flexeril (Cyclobenzaprine Hcl) Other (See Comments)    Unknown   . Lipitor (Atorvastatin)     Rash, muscle aches and chest pressure  . Meloxicam     AR   nasal congestion  . Penicillins     childhood    Physical Exam General: well developed, well nourished, seated, in no evident distress Head: head normocephalic and atraumatic. Orohparynx benign Neck: supple with no carotid or supraclavicular bruits Cardiovascular: regular rate and rhythm, no murmurs Musculoskeletal: no  deformity Skin:  no rash/petichiae Vascular:  Normal pulses all extremities  Neurologic Exam Mental Status: Awake and fully alert. Oriented to place and time. Recent and remote memory intact. Attention span, concentration and fund of knowledge appropriate. Mood and affect appropriate. Recall 3/3. Animal naming test 15. Cranial Nerves: Fundoscopic exam reveals sharp disc margins. Pupils equal, briskly reactive to light. Extraocular movements full without nystagmus. Visual fields full to confrontation. Hearing intact. Facial sensation intact. Face, tongue, palate moves normally and symmetrically.  Motor: Normal bulk and tone. Normal strength in all tested extremity muscles. Sensory.: intact to tough and pinprick and vibratory.  Coordination: Rapid alternating movements normal in all extremities. Finger-to-nose and heel-to-shin performed accurately bilaterally. Gait and Station: Arises from chair without difficulty. Stance is normal. Gait demonstrates normal stride length and balance . Able to heel, toe and tandem walk without difficulty.  Reflexes: 1+ and symmetric. Toes downgoing.     ASSESSMENT: 52 year male transient episode of confusion in March 2014 and MRI showing left medial hippocampal lesion of unclear etiology-small infarct versus ictal abnormality or structural lesion.No significant vascular risk factors except borderline hyperlipidimia. Factor 5 leiden heterozygous state likely unrelated and incidental.    PLAN: Continue aspirin for stroke prevention and fish oil capsules and low fat diet for hyperlipidimia as he is statin intolerant. Check EEG for seizures and repeat brain MRI w/o to follow left hippocampal MRI lesion.Check TEE and TCD bubble study.If all above work up negative may consider event monitor given prior h/o palpititions.He was advised to use androgel as I am not aware of any increased stroke risk association.

## 2012-05-02 NOTE — Patient Instructions (Signed)
He was advised to continue aspirin and low-fat diet. He is reluctant to try statins. Repeat lipid profile in 2 months and if the LDL cholesterol is greater than 100 consider Crestor every other day since he did not tolerate Lipitor. Check transcranial Doppler bubble study with emboli monitoring and transesophageal echocardiogram. Check EEG for seizures. Continue Cardionet monitor and if negative for paroxysmal atrial fibrillation may consider doing a event monitor in the future. The patient is heterozygote for factor V 5 Leiden but this is typically not been associated with arterial clots. The patient is concerned about AndroGel use and increased risk of stroke but I am not aware of any association and advised him to continue using it.

## 2012-05-02 NOTE — Telephone Encounter (Signed)
PT DECLINE APPT WILL NOTIFY REFERRING OFFICE.

## 2012-05-03 ENCOUNTER — Other Ambulatory Visit (INDEPENDENT_AMBULATORY_CARE_PROVIDER_SITE_OTHER): Payer: Federal, State, Local not specified - PPO | Admitting: Radiology

## 2012-05-03 DIAGNOSIS — F05 Delirium due to known physiological condition: Secondary | ICD-10-CM

## 2012-05-03 DIAGNOSIS — I6350 Cerebral infarction due to unspecified occlusion or stenosis of unspecified cerebral artery: Secondary | ICD-10-CM

## 2012-05-03 NOTE — Telephone Encounter (Signed)
Spoke to him and answered questions.

## 2012-05-03 NOTE — Progress Notes (Signed)
Please call patient let him know I am still awaiting a consult with the coagulation specialist he was referred to by Dr. Everardo All. Dr. Everardo All does not feel he should take testosterone. Dr. Pearlean Brownie feels it could be taken. We still need to see what the specialists says.

## 2012-05-05 NOTE — Telephone Encounter (Signed)
Outpatient and let him know have spoken to Dr. Pearlean Brownie. I would like him to keep the appointment he has with the coagulation specialist he was referred to by Dr. Everardo All. Once that referral is done possibly he and Claris Che could come in for an appointment to discuss his issues.

## 2012-05-05 NOTE — Telephone Encounter (Signed)
I called patient, to advise, patient has canceled the appt with hematology he states he does not want to go. He states he did not care for Dr Everardo All, states the doctor insulted him. Dr Pearlean Brownie indicates the testosterone did not cause the stroke he had, he does plan to follow up with you in one month, he states he is tired of doctors and doctors appts now.

## 2012-05-16 ENCOUNTER — Ambulatory Visit: Payer: Federal, State, Local not specified - PPO | Admitting: Neurology

## 2012-05-25 ENCOUNTER — Ambulatory Visit (INDEPENDENT_AMBULATORY_CARE_PROVIDER_SITE_OTHER): Payer: Federal, State, Local not specified - PPO

## 2012-05-25 ENCOUNTER — Encounter: Payer: Self-pay | Admitting: Neurology

## 2012-05-25 ENCOUNTER — Ambulatory Visit (INDEPENDENT_AMBULATORY_CARE_PROVIDER_SITE_OTHER): Payer: Federal, State, Local not specified - PPO | Admitting: Neurology

## 2012-05-25 VITALS — BP 125/84 | HR 55 | Temp 98.1°F

## 2012-05-25 DIAGNOSIS — I6350 Cerebral infarction due to unspecified occlusion or stenosis of unspecified cerebral artery: Secondary | ICD-10-CM

## 2012-05-25 DIAGNOSIS — Z0289 Encounter for other administrative examinations: Secondary | ICD-10-CM

## 2012-05-25 NOTE — Patient Instructions (Signed)
Continue aspirin for stroke prevention and fish oil capsules and low-fat diet for hyperlipidemia has a statin intolerant.Advised to keep appointment for checking EEG and MRI scan. Return for followup in 2 months or call earlier if necessary

## 2012-05-25 NOTE — Progress Notes (Signed)
[facility] [address] [place]. [place] [address] [phone]       OFFICE CONSULT NOTE  Mr. Cody Hall Date of Birth:  Apr 07, 1960 Medical Record Number:  [id number]   Referring MD:  Lesle Chris, MD  Reason for Referral:  Stroke  HPI: Cody Hall is an 52 y.o. 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 He did have a meeting just before this and issues were brought up that he did not recall from 2 weeks before. He believes that this all started around 0930 on 03/17/2012. He describes this as being "foggy". His wife  states he has no previous memory difficulties. He had right temporal headache. He has a history of migraines years ago, but none recently and this is unlike them. He did have a viral syndrome with vomiting about 3 weeks ago and describes a chest pain at that time.His headache and confusion cleared in a few hours. CT head was normal and MRI showed a small left medial temporal/hippocampal DWI positive/ADC dark lesion felt to be a small acute infarct. Carotid dopplers and transthoraxic echo were normal.Lipids were borderline with LDL 108 and he refused statins due to h/o intolerance in past. HbA1c was normal. He had hypercoagulable panel labs which were all normal except for factor 5 leiden heterozygous state. He has no h/o DVT, PE or any clots anywhere. There is no f/h/o strokes/MI in young age. He has no h/o seizures ar active migraine headaches. He had mild right temporal headche which resolved in  afew days after this evenmt and was not sever enough to need any medicines.  He did have palpitations years ago, saw Dr. Verdis Prime. He cut out his caffeine, decreased his stress level and they went away. He stated that a couple of weeks ago that he did have a "few seconds" of palpitations that he had not had since seeing Dr. Katrinka Blazing. Today:He returns for followup after last  visit with me on 05/02/12. He continues to do well and states he has not had any further memory problems or new neurological symptoms. He has return to work and seems to be tolerating it quite well. He has not yet scheduled MRI scan and EEG and is thinking about doing so. He had transcranial Doppler emboli monitoring earlier today which shows no spontaneous cerebral emboli. He is here today for the transcranial Doppler bubble study  ROS:   14 system review of systems is positive for headache and confusion only  PMH:  Past Medical History  Diagnosis Date  . Low testosterone   . Other malaise and fatigue   . Bronchospasm   . Seasonal allergies   . Reflux   . Tic     FACIAL  . Elevated cholesterol     LDL  . Chronic constipation   . GERD (gastroesophageal reflux disease)   . CVA (cerebral vascular accident)     Social History:  History   Social History  . Marital Status: Married    Spouse Name: Claris Che    Number of Children: 1  . Years of Education: college   Occupational History  .     Social History Main Topics  . Smoking status: Never Smoker   . Smokeless tobacco: Never Used  . Alcohol Use: No  . Drug Use: No  . Sexually Active: Not on file   Other Topics Concern  . 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    Medications:   Current Outpatient Prescriptions on File Prior to Visit  Medication Sig Dispense Refill  . ANDROGEL 50 MG/5GM GEL       . aspirin 325 MG tablet Take 1 tablet (325 mg total) by mouth daily.      . Cholecalciferol (VITAMIN D3) 2000 UNITS TABS Take 1 tablet by mouth at bedtime.       Marland Kitchen desloratadine (CLARINEX) 5 MG tablet Take 1 tablet (5 mg total) by mouth daily.  30 tablet  11  . DHEA 25 MG CAPS Take 1 capsule by mouth.       No current facility-administered medications on file prior to visit.    Allergies:   Allergies  Allergen Reactions  . Flexeril (Cyclobenzaprine  Hcl) Other (See Comments)    Unknown   . Lipitor (Atorvastatin)     Rash, muscle aches and chest pressure  . Meloxicam     AR   nasal congestion  . Penicillins     childhood   Filed Vitals:   05/25/12 1434  BP: 125/84  Pulse: 55  Temp: 98.1 F (36.7 C)    Physical Exam General: well developed, well nourished, seated, in no evident distress Head: head normocephalic and atraumatic. Orohparynx benign Neck: supple with no carotid or supraclavicular bruits Cardiovascular: regular rate and rhythm, no murmurs Musculoskeletal: no deformity Skin:  no rash/petichiae Vascular:  Normal pulses all extremities  Neurologic Exam Mental Status: Awake and fully alert. Oriented to place and time. Recent and remote memory intact. Attention span, concentration and fund of knowledge appropriate. Mood and affect appropriate. Recall 3/3.   Cranial Nerves: Fundoscopic exam reveals sharp disc margins. Pupils equal, briskly reactive to light. Extraocular movements full without nystagmus. Visual fields full to confrontation. Hearing intact. Facial sensation intact. Face, tongue, palate moves normally and symmetrically.  Motor: Normal bulk and tone. Normal strength in all tested extremity muscles. Sensory.: intact to tough and pinprick and vibratory.  Coordination: Rapid alternating movements normal in all extremities. Finger-to-nose and heel-to-shin performed accurately bilaterally. Gait and Station: Arises from chair without difficulty. Stance is normal. Gait demonstrates normal stride length and balance . Able to heel, toe and tandem walk without difficulty.  Reflexes: 1+ and symmetric. Toes downgoing.     ASSESSMENT: 52 year male transient episode of confusion in March 2014 and MRI showing left medial hippocampal lesion of unclear etiology-small infarct versus transient global amnesia or structural lesion.No significant vascular risk factors except borderline hyperlipidimia. Factor 5 leiden heterozygous  state likely unrelated and incidental. Transcranial Doppler study would be beneficial to evaluate for intracardiac shunt.    PLAN: Continue aspirin for stroke prevention and fish oil capsules and low fat diet for hyperlipidimia as he is statin intolerant. Check EEG for seizures and repeat brain MRI w/o to follow left hippocampal MRI lesion.Check TEE and TCD bubble study .He was advised to use androgel as I am not aware of any increased stroke risk association.      [facility]      [address]      [place]. [place] [address] [phone]       TRANSCRANIAL DOPPLER BUBBLE STUDY   Mr. Josha Weekley Date of Birth:  04-16-1960 Medical Record Number:  [id number]   Indications: Diagnostic Date of Procedure: 05/25/12 Clinical History:  52 year-old patient with left hippocampal stroke Technical Description:  Transcranial Doppler Bubble Study was performed at the bedside after taking written informed consent from the patient and explaining risk/benefits. Both middle cerebral arteries were insonated using a headset. And IV line was inserted in the right forearm by the RN using aseptic precautions. Agitated saline injection at rest and after valsalva maneuver did not result in high intensity transient signals (HITS).   Impression:  Negative Transcranial Doppler Bubble Study  not indicative of right to left intracardiac shunt.   Results were explained to the patient. Questions were answered.

## 2012-05-29 ENCOUNTER — Telehealth: Payer: Self-pay | Admitting: *Deleted

## 2012-05-29 NOTE — Telephone Encounter (Signed)
I spoke with the patient regarding his event monitor results "sinus rhythm/sinus tach/no arrhythmia" per Dr. Excell Seltzer. Per the patient, he did follow up with Dr. Pearlean Brownie last week and he is still wanting the patient to have a TEE done. The patient reports Dr. Excell Seltzer had mentioned at one point he was not certain the patient needed this. He would like Dr. Excell Seltzer to be aware and advise. I explained I will forward the message to Dr. Excell Seltzer and Leotis Shames for review prior to TEE being scheduled.

## 2012-06-05 NOTE — Telephone Encounter (Signed)
Spoke with patient 5/23. He plans to see if repeat MRI Brain shows evidence of stroke, and if so will pursue TEE. I advised this is really Dr Marlis Edelson area of expertise and not mine. Will be happy to arrange TEE if he decides to pursue.  Tonny Bollman 06/05/2012 9:49 PM

## 2012-06-20 ENCOUNTER — Ambulatory Visit
Admission: RE | Admit: 2012-06-20 | Discharge: 2012-06-20 | Disposition: A | Discharge status: Home / Self Care | Payer: Federal, State, Local not specified - PPO | Source: Ambulatory Visit | Attending: Neurology | Admitting: Neurology

## 2012-06-20 DIAGNOSIS — R569 Unspecified convulsions: Secondary | ICD-10-CM

## 2012-06-20 DIAGNOSIS — I6350 Cerebral infarction due to unspecified occlusion or stenosis of unspecified cerebral artery: Secondary | ICD-10-CM

## 2012-06-20 DIAGNOSIS — F05 Delirium due to known physiological condition: Secondary | ICD-10-CM

## 2012-06-20 MED ORDER — GADOBENATE DIMEGLUMINE 529 MG/ML IV SOLN
13.0000 mL | Freq: Once | INTRAVENOUS | Status: AC | PRN
  Administered 2012-06-20: 13 mL via INTRAVENOUS

## 2012-06-23 ENCOUNTER — Ambulatory Visit (INDEPENDENT_AMBULATORY_CARE_PROVIDER_SITE_OTHER): Payer: Federal, State, Local not specified - PPO | Admitting: Emergency Medicine

## 2012-06-23 ENCOUNTER — Telehealth: Payer: Self-pay | Admitting: Neurology

## 2012-06-23 VITALS — BP 104/65 | HR 44 | Temp 98.1°F | Resp 16 | Ht 67.5 in | Wt 142.4 lb

## 2012-06-23 DIAGNOSIS — D6859 Other primary thrombophilia: Secondary | ICD-10-CM

## 2012-06-23 DIAGNOSIS — D6851 Activated protein C resistance: Secondary | ICD-10-CM

## 2012-06-23 DIAGNOSIS — E291 Testicular hypofunction: Secondary | ICD-10-CM

## 2012-06-23 DIAGNOSIS — K645 Perianal venous thrombosis: Secondary | ICD-10-CM

## 2012-06-23 MED ORDER — HYDROCORTISONE 1 % EX CREA: TOPICAL_CREAM | Freq: Two times a day (BID) | CUTANEOUS | Status: DC

## 2012-06-23 NOTE — Patient Instructions (Signed)
Hemorrhoids Hemorrhoids are swollen veins around the rectum or anus. There are two types of hemorrhoids:   Internal hemorrhoids. These occur in the veins just inside the rectum. They may poke through to the outside and become irritated and painful.  External hemorrhoids. These occur in the veins outside the anus and can be felt as a painful swelling or hard lump near the anus. CAUSES  Pregnancy.   Obesity.   Constipation or diarrhea.   Straining to have a bowel movement.   Sitting for long periods on the toilet.  Heavy lifting or other activity that caused you to strain.  Anal intercourse. SYMPTOMS   Pain.   Anal itching or irritation.   Rectal bleeding.   Fecal leakage.   Anal swelling.   One or more lumps around the anus.  DIAGNOSIS  Your caregiver may be able to diagnose hemorrhoids by visual examination. Other examinations or tests that may be performed include:   Examination of the rectal area with a gloved hand (digital rectal exam).   Examination of anal canal using a small tube (scope).   A blood test if you have lost a significant amount of blood.  A test to look inside the colon (sigmoidoscopy or colonoscopy). TREATMENT Most hemorrhoids can be treated at home. However, if symptoms do not seem to be getting better or if you have a lot of rectal bleeding, your caregiver may perform a procedure to help make the hemorrhoids get smaller or remove them completely. Possible treatments include:   Placing a rubber band at the base of the hemorrhoid to cut off the circulation (rubber band ligation).   Injecting a chemical to shrink the hemorrhoid (sclerotherapy).   Using a tool to burn the hemorrhoid (infrared light therapy).   Surgically removing the hemorrhoid (hemorrhoidectomy).   Stapling the hemorrhoid to block blood flow to the tissue (hemorrhoid stapling).  HOME CARE INSTRUCTIONS   Eat foods with fiber, such as whole grains, beans,  nuts, fruits, and vegetables. Ask your doctor about taking products with added fiber in them (fibersupplements).  Increase fluid intake. Drink enough water and fluids to keep your urine clear or pale yellow.   Exercise regularly.   Go to the bathroom when you have the urge to have a bowel movement. Do not wait.   Avoid straining to have bowel movements.   Keep the anal area dry and clean. Use wet toilet paper or moist towelettes after a bowel movement.   Medicated creams and suppositories may be used or applied as directed.   Only take over-the-counter or prescription medicines as directed by your caregiver.   Take warm sitz baths for 15 20 minutes, 3 4 times a day to ease pain and discomfort.   Place ice packs on the hemorrhoids if they are tender and swollen. Using ice packs between sitz baths may be helpful.   Put ice in a plastic bag.   Place a towel between your skin and the bag.   Leave the ice on for 15 20 minutes, 3 4 times a day.   Do not use a donut-shaped pillow or sit on the toilet for long periods. This increases blood pooling and pain.  SEEK MEDICAL CARE IF:  You have increasing pain and swelling that is not controlled by treatment or medicine.  You have uncontrolled bleeding.  You have difficulty or you are unable to have a bowel movement.  You have pain or inflammation outside the area of the hemorrhoids. MAKE SURE YOU:    Understand these instructions.  Will watch your condition.  Will get help right away if you are not doing well or get worse. Document Released: 12/26/1999 Document Revised: 12/15/2011 Document Reviewed: 11/02/2011 [employer] Patient Information 2014 [employer].  

## 2012-06-23 NOTE — Progress Notes (Signed)
  Subjective:    Patient ID: Candie Echevaria, male    DOB: Sep 19, 1960, 52 y.o.   MRN: [id number]  HPI patient enters because 3 weeks ago he noticed a tender firm area in his anal region. He had his colonoscopy earlier this year and it was normal. He has not had any active bleeding. He carries Leiden factor V mutation    Review of Systems patient had his MRI yesterday for followup of a admission for acute confusional state felt to be possibly secondary to stroke. MRI done yesterday was completely normal. Dr. Pearlean Brownie has instructed patient he does not feel he has had a stroke    Objective:   Physical Exam patient is alert and cooperative in no distress. There is a one by one one half centimeter thrombosed hemorrhoid present at 6:00. This area was prepped with Betadine on with 1 cc of 2% plain and #11 blade was used and a large clot was obtained. Dry gauze was applied.        Assessment & Plan:  Routine labs were done to check on the status of his low testosterone. He also has a history of hyperlipidemia and a lipid panel was done. I gave him Anusol-HC to use for his hemorrhoidal disease.

## 2012-06-24 LAB — LIPID PANEL
HDL: 49 mg/dL (ref >39)
LDL Cholesterol: 87 mg/dL (ref 0–99)

## 2012-06-24 LAB — LUTEINIZING HORMONE: LH: 2.8 m[IU]/mL (ref 1.5–9.3)

## 2012-06-24 LAB — PSA: PSA: 0.34 ng/mL (ref <=4.00)

## 2012-06-24 NOTE — Telephone Encounter (Signed)
I spoke to patient and gave MRI results

## 2012-06-26 LAB — TESTOSTERONE, FREE, TOTAL, SHBG
Sex Hormone Binding: 50 nmol/L (ref 13–71)
Testosterone, Free: 62.2 pg/mL (ref 47.0–244.0)
Testosterone-% Free: 1.5 % — ABNORMAL LOW (ref 1.6–2.9)
Testosterone: 402 ng/dL (ref 300–890)

## 2012-06-30 LAB — DHEA: DHEA: 416 ng/dL (ref 61–1636)

## 2012-07-01 ENCOUNTER — Encounter: Payer: Self-pay | Admitting: *Deleted

## 2012-09-19 ENCOUNTER — Encounter: Payer: Self-pay | Admitting: Emergency Medicine

## 2012-09-19 ENCOUNTER — Ambulatory Visit (INDEPENDENT_AMBULATORY_CARE_PROVIDER_SITE_OTHER): Payer: Federal, State, Local not specified - PPO | Admitting: Emergency Medicine

## 2012-09-19 VITALS — BP 105/72 | HR 62 | Temp 98.3°F | Resp 16 | Ht 67.5 in | Wt 140.0 lb

## 2012-09-19 DIAGNOSIS — R5381 Other malaise: Secondary | ICD-10-CM

## 2012-09-19 DIAGNOSIS — E291 Testicular hypofunction: Secondary | ICD-10-CM

## 2012-09-19 DIAGNOSIS — E785 Hyperlipidemia, unspecified: Secondary | ICD-10-CM

## 2012-09-19 LAB — CBC WITH DIFFERENTIAL/PLATELET
Basophils Relative: 0 % (ref 0–1)
Eosinophils Absolute: 0.2 10*3/uL (ref 0.0–0.7)
Lymphs Abs: 1.2 10*3/uL (ref 0.7–4.0)
MCH: 29.4 pg (ref 26.0–34.0)
Neutro Abs: 3 10*3/uL (ref 1.7–7.7)
Neutrophils Relative %: 62 % (ref 43–77)
Platelets: 145 10*3/uL — ABNORMAL LOW (ref 150–400)
RBC: 4.86 MIL/uL (ref 4.22–5.81)

## 2012-09-19 LAB — LIPID PANEL: LDL Cholesterol: 95 mg/dL (ref 0–99)

## 2012-09-19 NOTE — Progress Notes (Signed)
  Subjective:    Patient ID: Cody Hall, male    DOB: 1960/02/05, 52 y.o.   MRN: [id number]  HPI patient in for followup of hypogonadism with low testosterone. He suffered an event associated with amnesia which initially was felt to be due to the stroke. He was tested and found to be positive for Leiden factor V. Since that time he has had a testosterone level checked which is back within normal range off of testosterone replacement. He has seen Dr. Pearlean Brownie who is unsure of what the event was. He has not diagnosed him with a stroke. His current diagnosis is either a seizure or global amnesia. He is currently off of androgen replacement and currently does take DHEA over-the-counter capsules.    Review of Systems     Objective:   Physical Exam patient is alert and cooperative. His neck is supple. His chest is clear. Heart regular rate no murmurs. Abdomen soft nontender        Assessment & Plan:  Cholesterol and testosterone levels were checked today. I did also check a DHEA level because he is on over-the-counter supplements. He is not on a cholesterol medication but recently has had worsening of his diet. His last cholesterol was 150

## 2012-09-20 LAB — TESTOSTERONE, FREE, TOTAL, SHBG
Testosterone, Free: 78.9 pg/mL (ref 47.0–244.0)
Testosterone-% Free: 1.6 % (ref 1.6–2.9)
Testosterone: 495 ng/dL (ref 300–890)

## 2012-09-20 LAB — VITAMIN B12: Vitamin B-12: 316 pg/mL (ref 211–911)

## 2012-09-25 ENCOUNTER — Encounter: Payer: Self-pay | Admitting: Family Medicine

## 2012-09-25 LAB — DHEA: DHEA: 304 ng/dL (ref 61–1636)

## 2012-11-16 ENCOUNTER — Other Ambulatory Visit: Payer: Self-pay

## 2013-05-21 ENCOUNTER — Other Ambulatory Visit: Payer: Self-pay | Admitting: Emergency Medicine

## 2013-08-21 ENCOUNTER — Ambulatory Visit (INDEPENDENT_AMBULATORY_CARE_PROVIDER_SITE_OTHER): Payer: Federal, State, Local not specified - PPO | Admitting: Emergency Medicine

## 2013-08-21 ENCOUNTER — Encounter: Payer: Self-pay | Admitting: Emergency Medicine

## 2013-08-21 ENCOUNTER — Other Ambulatory Visit: Payer: Self-pay | Admitting: Emergency Medicine

## 2013-08-21 VITALS — BP 110/60 | HR 60 | Temp 98.4°F | Resp 16 | Wt 140.0 lb

## 2013-08-21 DIAGNOSIS — E291 Testicular hypofunction: Secondary | ICD-10-CM

## 2013-08-21 DIAGNOSIS — D6859 Other primary thrombophilia: Secondary | ICD-10-CM

## 2013-08-21 DIAGNOSIS — D6851 Activated protein C resistance: Secondary | ICD-10-CM

## 2013-08-21 DIAGNOSIS — R5383 Other fatigue: Secondary | ICD-10-CM

## 2013-08-21 DIAGNOSIS — E785 Hyperlipidemia, unspecified: Secondary | ICD-10-CM

## 2013-08-21 DIAGNOSIS — R5381 Other malaise: Secondary | ICD-10-CM

## 2013-08-21 LAB — CBC WITH DIFFERENTIAL/PLATELET
Basophils Absolute: 0.1 10*3/uL (ref 0.0–0.1)
Basophils Relative: 1 % (ref 0–1)
EOS ABS: 0.2 10*3/uL (ref 0.0–0.7)
EOS PCT: 3 % (ref 0–5)
HEMATOCRIT: 42.6 % (ref 39.0–52.0)
HEMOGLOBIN: 14.2 g/dL (ref 13.0–17.0)
LYMPHS ABS: 1.1 10*3/uL (ref 0.7–4.0)
LYMPHS PCT: 21 % (ref 12–46)
MCH: 29.2 pg (ref 26.0–34.0)
MCHC: 33.3 g/dL (ref 30.0–36.0)
MCV: 87.5 fL (ref 78.0–100.0)
MONO ABS: 0.4 10*3/uL (ref 0.1–1.0)
MONOS PCT: 8 % (ref 3–12)
Neutro Abs: 3.5 10*3/uL (ref 1.7–7.7)
Neutrophils Relative %: 67 % (ref 43–77)
Platelets: 152 10*3/uL (ref 150–400)
RBC: 4.87 MIL/uL (ref 4.22–5.81)
RDW: 13.3 % (ref 11.5–15.5)
WBC: 5.2 10*3/uL (ref 4.0–10.5)

## 2013-08-21 LAB — COMPLETE METABOLIC PANEL WITH GFR
ALBUMIN: 4.7 g/dL (ref 3.5–5.2)
ALT: 18 U/L (ref 0–53)
AST: 19 U/L (ref 0–37)
Alkaline Phosphatase: 38 U/L — ABNORMAL LOW (ref 39–117)
BUN: 14 mg/dL (ref 6–23)
CO2: 27 mEq/L (ref 19–32)
CREATININE: 0.89 mg/dL (ref 0.50–1.35)
Calcium: 9.5 mg/dL (ref 8.4–10.5)
Chloride: 104 mEq/L (ref 96–112)
Glucose, Bld: 92 mg/dL (ref 70–99)
Potassium: 4.1 mEq/L (ref 3.5–5.3)
Sodium: 137 mEq/L (ref 135–145)
Total Bilirubin: 0.6 mg/dL (ref 0.2–1.2)
Total Protein: 6.6 g/dL (ref 6.0–8.3)

## 2013-08-21 LAB — LIPID PANEL
CHOL/HDL RATIO: 3.3 ratio
CHOLESTEROL: 180 mg/dL (ref 0–200)
HDL: 55 mg/dL (ref >39)
LDL Cholesterol: 114 mg/dL — ABNORMAL HIGH (ref 0–99)
Triglycerides: 55 mg/dL (ref <150)
VLDL: 11 mg/dL (ref 0–40)

## 2013-08-21 LAB — TSH: TSH: 1.952 u[IU]/mL (ref 0.350–4.500)

## 2013-08-21 NOTE — Progress Notes (Signed)
Subjective:    Patient ID: Cody Hall, male    DOB: 04/10/1960, 53 y.o.   MRN: [id number] This chart was scribed for Collene GobbleSteven A Annastyn Silvey, MD by Julian HyMorgan Graham, ED Scribe. The patient was seen in Room 21. The patient's care was started at 3:00 PM.   Chief Complaint  Patient presents with  . Hypogonadism    states not using androgel  . Abnormal Lab    here for repeat labs   Past Medical History  Diagnosis Date  . Low testosterone   . Other malaise and fatigue   . Bronchospasm   . Seasonal allergies   . Reflux   . Tic     FACIAL  . Elevated cholesterol     LDL  . Chronic constipation   . GERD (gastroesophageal reflux disease)   . CVA (cerebral vascular accident)    Current Outpatient Prescriptions on File Prior to Visit  Medication Sig Dispense Refill  . aspirin 325 MG tablet Take 1 tablet (325 mg total) by mouth daily.      . Cholecalciferol (VITAMIN D3) 2000 UNITS TABS Take 1 tablet by mouth at bedtime.       Marland Kitchen. desloratadine (CLARINEX) 5 MG tablet Take 1 tablet (5 mg total) by mouth daily.  30 tablet  11  . DHEA 25 MG CAPS Take 1 capsule by mouth.       No current facility-administered medications on file prior to visit.   Allergies  Allergen Reactions  . Flexeril [Cyclobenzaprine Hcl] Other (See Comments)    Unknown   . Lipitor [Atorvastatin]     Rash, muscle aches and chest pressure  . Meloxicam     AR   nasal congestion  . Penicillins     childhood    HPI HPI Comments: Cody Echevariaric Taras is a 53 y.o. male who presents to the [facility] for follow-up visit. Pt reports some fatigue. Pt reports he has recently gone from a vegetarian/vegan diet to meat & vegetables daily. Pt reports he drinks fruit smoothies daily and attempts to eat wheat bread.  Pt reports he has stopped taking Testerone for over a year. Pt denies having a neurologic episode since his last reported one.   Pt reports he still works for the [employer]. Pt reports he is 3 years  away from the option of retirement.  Review of Systems  Constitutional: Positive for fatigue. Negative for fever and chills.  Respiratory: Negative for shortness of breath.   Gastrointestinal: Negative for nausea and vomiting.  Neurological: Negative for weakness.    Objective:  Triage Vitals: BP 110/60  Pulse 60  Temp(Src) 98.4 F (36.9 C)  Resp 16  Wt 140 lb (63.504 kg)  SpO2 98%  Physical Exam CONSTITUTIONAL: Well developed/well nourished HEAD: Normocephalic/atraumatic EYES: EOMI/PERRL ENMT: Mucous membranes moist NECK: supple no meningeal signs SPINE:entire spine nontender CV: S1/S2 noted, no murmurs/rubs/gallops noted LUNGS: Lungs are clear to auscultation bilaterally, no apparent distress ABDOMEN: soft, nontender, no rebound or guarding GU:no cva tenderness NEURO: Pt is awake/alert, moves all extremitiesx4 EXTREMITIES: pulses normal, full ROM SKIN: warm, color normal PSYCH: no abnormalities of mood noted   Assessment & Plan:  3:08 PM- Patient informed of current plan for treatment and evaluation and agrees with plan at this time. Routine labs for evaluation of hypogonadism were done. We'll contact him once these are done. Lipid panel was also repeated in that he has had an elevated LDL in the past .I personally performed the  services described in this documentation, which was scribed in my presence. The recorded information has been reviewed and is accurate.

## 2013-08-22 LAB — VITAMIN D 25 HYDROXY (VIT D DEFICIENCY, FRACTURES): VIT D 25 HYDROXY: 57 ng/mL (ref 30–89)

## 2013-08-22 LAB — FSH/LH
FSH: 4.7 m[IU]/mL (ref 1.4–18.1)
LH: 4.2 m[IU]/mL (ref 1.5–9.3)

## 2013-08-23 LAB — TESTOSTERONE, FREE, TOTAL, SHBG
Sex Hormone Binding: 49 nmol/L (ref 13–71)
TESTOSTERONE FREE: 49.5 pg/mL (ref 47.0–244.0)
Testosterone-% Free: 1.5 % — ABNORMAL LOW (ref 1.6–2.9)
Testosterone: 323 ng/dL (ref 300–890)

## 2013-08-27 ENCOUNTER — Encounter: Payer: Self-pay | Admitting: Family Medicine

## 2013-08-27 LAB — DHEA: DHEA: 298 ng/dL (ref 61–1636)

## 2013-08-29 ENCOUNTER — Encounter: Payer: Self-pay | Admitting: *Deleted

## 2014-05-29 ENCOUNTER — Ambulatory Visit (INDEPENDENT_AMBULATORY_CARE_PROVIDER_SITE_OTHER): Payer: Federal, State, Local not specified - PPO

## 2014-05-29 ENCOUNTER — Ambulatory Visit (INDEPENDENT_AMBULATORY_CARE_PROVIDER_SITE_OTHER): Payer: Federal, State, Local not specified - PPO | Admitting: Family Medicine

## 2014-05-29 VITALS — BP 120/84 | HR 57 | Temp 97.5°F | Resp 16 | Ht 67.5 in | Wt 150.0 lb

## 2014-05-29 DIAGNOSIS — M25511 Pain in right shoulder: Secondary | ICD-10-CM

## 2014-05-29 DIAGNOSIS — M5412 Radiculopathy, cervical region: Secondary | ICD-10-CM | POA: Diagnosis not present

## 2014-05-29 DIAGNOSIS — R0602 Shortness of breath: Secondary | ICD-10-CM

## 2014-05-29 DIAGNOSIS — R079 Chest pain, unspecified: Secondary | ICD-10-CM

## 2014-05-29 DIAGNOSIS — M546 Pain in thoracic spine: Secondary | ICD-10-CM | POA: Diagnosis not present

## 2014-05-29 DIAGNOSIS — M539 Dorsopathy, unspecified: Secondary | ICD-10-CM | POA: Diagnosis not present

## 2014-05-29 DIAGNOSIS — M549 Dorsalgia, unspecified: Secondary | ICD-10-CM

## 2014-05-29 DIAGNOSIS — R29898 Other symptoms and signs involving the musculoskeletal system: Secondary | ICD-10-CM

## 2014-05-29 LAB — POCT CBC
Granulocyte percent: 70.5 %G (ref 37–80)
HCT, POC: 45.3 % (ref 43.5–53.7)
HEMOGLOBIN: 14.3 g/dL (ref 14.1–18.1)
Lymph, poc: 1.4 (ref 0.6–3.4)
MCH, POC: 28.3 pg (ref 27–31.2)
MCHC: 31.5 g/dL — AB (ref 31.8–35.4)
MCV: 89.9 fL (ref 80–97)
MID (cbc): 0.4 (ref 0–0.9)
MPV: 98.8 fL (ref 0–99.8)
POC GRANULOCYTE: 4.3 (ref 2–6.9)
POC LYMPH PERCENT: 23.2 %L (ref 10–50)
POC MID %: 6.3 %M (ref 0–12)
Platelet Count, POC: 126 10*3/uL — AB (ref 142–424)
RBC: 5.04 M/uL (ref 4.69–6.13)
RDW, POC: 15.9 %
WBC: 6.1 10*3/uL (ref 4.6–10.2)

## 2014-05-29 LAB — POCT SEDIMENTATION RATE: POCT SED RATE: 4 mm/h (ref 0–22)

## 2014-05-29 LAB — D-DIMER, QUANTITATIVE (NOT AT ARMC): D DIMER QUANT: 0.27 ug{FEU}/mL (ref 0.00–0.48)

## 2014-05-29 MED ORDER — METAXALONE 800 MG PO TABS: 800.0000 mg | ORAL_TABLET | Freq: Three times a day (TID) | ORAL | Status: DC | PRN

## 2014-05-29 NOTE — Patient Instructions (Addendum)
Your xray indicates possible pinched nerve from lower neck. I did not see any concerns on your blood work or chest xray, but test for inflammation and blood clot are still not back.  Those will be back later tonight and if elevated - I will call you to discuss other testing if needed.   Try the skelaxin three times per day as needed for possible pinched nerve and muscle spasm, advil over the counter if needed (stop if stomach upset). If not improving in next 5 days - return for recheck. Return to the clinic or go to the nearest emergency room if any of your symptoms worsen or new symptoms occur.  If any return of chest pain - be seen here or emergency room as discussed.   Cervical Radiculopathy Cervical radiculopathy happens when a nerve in the neck is pinched or bruised by a slipped (herniated) disk or by arthritic changes in the bones of the cervical spine. This can occur due to an injury or as part of the normal aging process. Pressure on the cervical nerves can cause pain or numbness that runs from your neck all the way down into your arm and fingers. CAUSES  There are many possible causes, including:  Injury.  Muscle tightness in the neck from overuse.  Swollen, painful joints (arthritis).  Breakdown or degeneration in the bones and joints of the spine (spondylosis) due to aging.  Bone spurs that may develop near the cervical nerves. SYMPTOMS  Symptoms include pain, weakness, or numbness in the affected arm and hand. Pain can be severe or irritating. Symptoms may be worse when extending or turning the neck. DIAGNOSIS  Your caregiver will ask about your symptoms and do a physical exam. He or she may test your strength and reflexes. X-rays, CT scans, and MRI scans may be needed in cases of injury or if the symptoms do not go away after a period of time. Electromyography (EMG) or nerve conduction testing may be done to study how your nerves and muscles are working. TREATMENT  Your  caregiver may recommend certain exercises to help relieve your symptoms. Cervical radiculopathy can, and often does, get better with time and treatment. If your problems continue, treatment options may include:  Wearing a soft collar for short periods of time.  Physical therapy to strengthen the neck muscles.  Medicines, such as nonsteroidal anti-inflammatory drugs (NSAIDs), oral corticosteroids, or spinal injections.  Surgery. Different types of surgery may be done depending on the cause of your problems. HOME CARE INSTRUCTIONS   Put ice on the affected area.  Put ice in a plastic bag.  Place a towel between your skin and the bag.  Leave the ice on for 15-20 minutes, 03-04 times a day or as directed by your caregiver.  If ice does not help, you can try using heat. Take a warm shower or bath, or use a hot water bottle as directed by your caregiver.  You may try a gentle neck and shoulder massage.  Use a flat pillow when you sleep.  Only take over-the-counter or prescription medicines for pain, discomfort, or fever as directed by your caregiver.  If physical therapy was prescribed, follow your caregiver's directions.  If a soft collar was prescribed, use it as directed. SEEK IMMEDIATE MEDICAL CARE IF:   Your pain gets much worse and cannot be controlled with medicines.  You have weakness or numbness in your hand, arm, face, or leg.  You have a high fever or a stiff, rigid  neck.  You lose bowel or bladder control (incontinence).  You have trouble with walking, balance, or speaking. MAKE SURE YOU:   Understand these instructions.  Will watch your condition.  Will get help right away if you are not doing well or get worse. Document Released: 09/22/2000 Document Revised: 03/22/2011 Document Reviewed: 08/11/2010 [facility] Patient Information 2015 [employer], [place]. This information is not intended to replace advice given to you by your health care provider. Make sure you  discuss any questions you have with your health care provider.

## 2014-05-29 NOTE — Progress Notes (Signed)
Subjective:    Patient ID: Cody Hall, male    DOB: 07-29-1960, 54 y.o.   MRN: [id number] This chart was scribed for Merri Ray, MD, by Girtha Hake, ED Scribe. The patient was seen in Room 11. The patient's care was started at 6:19 PM.   HPI Cody Hall is a 54 y.o. Male who is her complaining of back pain. He has a history of CVA in March 2014. He was noted to have a Factor 5 Leiden mutation. He takes 1/2 of a 325 mg Aspirin approximately 4 days per week. He has a history of testosterone deficiency. He was seen by Dr. Loanne Drilling in April 2014. Dr. Loanne Drilling recommended against testosterone supplementation with recent CVA and hypergoagulable state.   Patient complains of sharp pain beginning three weeks ago near his right shoulder blade.  He first noticed the pain when he woke up one morning. He has experienced muscle spasms before and states that this pain feels similar to that. He denies any injury or trauma to the area. He reports that the pain became progressively worse and moved to his right side of his chest 2-3 days after it first onset.  He states the the pain is sometimes alleviated by 1-2 days of rest. The pain is exacerbated by repetitive movement at work.  He reports mild SOB today. He denies calf pain, leg swelling, headache, neck pain, or rash. Denies a history of smoking, asthma, or COPD. He has taken Aleve with minimal relief of pain. Application of icy-hot cream provides some relief.    Patient Active Problem List   Diagnosis Date Noted  . Acute confusional state 05/02/2012  . Factor 5 Leiden mutation, heterozygous 03/22/2012  . CVA (cerebral infarction) 03/17/2012  . Hyperlipidemia LDL goal < 100 03/17/2012  . Amnesia 03/17/2012  . Reflux   . Seasonal allergies   . Testosterone deficiency    Past Medical History  Diagnosis Date  . Low testosterone   . Other malaise and fatigue   . Bronchospasm   . Seasonal allergies   . Reflux   . Tic     FACIAL  .  Elevated cholesterol     LDL  . Chronic constipation   . GERD (gastroesophageal reflux disease)   . CVA (cerebral vascular accident)    Past Surgical History  Procedure Laterality Date  . Appendectomy     Allergies  Allergen Reactions  . Flexeril [Cyclobenzaprine Hcl] Other (See Comments)    Unknown   . Lipitor [Atorvastatin]     Rash, muscle aches and chest pressure  . Meloxicam     AR   nasal congestion  . Penicillins     childhood   Prior to Admission medications   Medication Sig Start Date End Date Taking? Authorizing Provider  aspirin 325 MG tablet Take 1 tablet (325 mg total) by mouth daily. 03/18/12  Yes Amber Fidel Levy, MD  Cholecalciferol (VITAMIN D3) 2000 UNITS TABS Take 1 tablet by mouth at bedtime.    Yes Historical Provider, MD  DHEA 25 MG CAPS Take 1 capsule by mouth.   Yes Historical Provider, MD  desloratadine (CLARINEX) 5 MG tablet Take 1 tablet (5 mg total) by mouth daily. Patient not taking: Reported on 05/29/2014 04/18/12   Darlyne Russian, MD   History   Social History  . Marital Status: Married    Spouse Name: Joycelyn Schmid  . Number of Children: 1  . Years of Education: college   Occupational History  .  Social History Main Topics  . Smoking status: Never Smoker   . Smokeless tobacco: Never Used  . Alcohol Use: No  . Drug Use: No  . Sexual Activity: Not on file   Other Topics Concern  . Not on file   Social History Narrative   EXERCISE HAND WEIGHTS   Pt lives at home with his spouse and chid.     Review of Systems  Cardiovascular: Negative for chest pain and leg swelling.  Musculoskeletal: Positive for back pain. Negative for neck pain.  Neurological: Negative for headaches.       Objective:   Physical Exam  Constitutional: He is oriented to person, place, and time. He appears well-developed and well-nourished.  HENT:  Head: Normocephalic and atraumatic.  Eyes: EOM are normal. Pupils are equal, round, and reactive to light.  Neck:  Normal range of motion. No JVD present. Carotid bruit is not present.  Full ROM at cervical spine, but he felt like his neck was tight. Non-tender along the cervical spine. Negative tinel at the brachial plexus bilaterally.   Cardiovascular: Normal rate, regular rhythm and normal heart sounds.   No murmur heard. Pulmonary/Chest: Effort normal and breath sounds normal. He has no rales.  Musculoskeletal: He exhibits tenderness. He exhibits no edema.       Right shoulder: He exhibits normal range of motion.  Minimal tenderness of the rhomboid. Full rotator cuff strength without reproduction of pain in right shoulder. Full ROM of right shoulder. Calves non-tender.  Neurological: He is alert and oriented to person, place, and time.  Skin: Skin is warm and dry. No rash noted.  Skin is intact.  Psychiatric: He has a normal mood and affect.  Vitals reviewed.  Filed Vitals:   05/29/14 1657  BP: 120/84  Pulse: 57  Temp: 97.5 F (36.4 C)  TempSrc: Oral  Resp: 16  Height: 5' 7.5" (1.715 m)  Weight: 150 lb (68.04 kg)  SpO2: 98%    EKG sinus bradycardia. Rate 52 with first degree A/V block. No apparent changes from 03/31/12.  [facility] reading (PRIMARY) by  Dr. Carlota Raspberry: C spine: decreased lordosis, decreased disc space C6-7.  CXR: no acute findings  Results for orders placed or performed in visit on 05/29/14  D-dimer, quantitative  Result Value Ref Range   D-Dimer, Quant 0.27 0.00 - 0.48 ug/mL-FEU  POCT CBC  Result Value Ref Range   WBC 6.1 4.6 - 10.2 K/uL   Lymph, poc 1.4 0.6 - 3.4   POC LYMPH PERCENT 23.2 10 - 50 %L   MID (cbc) 0.4 0 - 0.9   POC MID % 6.3 0 - 12 %M   POC Granulocyte 4.3 2 - 6.9   Granulocyte percent 70.5 37 - 80 %G   RBC 5.04 4.69 - 6.13 M/uL   Hemoglobin 14.3 14.1 - 18.1 g/dL   HCT, POC 45.3 43.5 - 53.7 %   MCV 89.9 80 - 97 fL   MCH, POC 28.3 27 - 31.2 pg   MCHC 31.5 (A) 31.8 - 35.4 g/dL   RDW, POC 15.9 %   Platelet Count, POC 126 (A) 142 - 424 K/uL   MPV 98.8 0 -  99.8 fL  POCT SEDIMENTATION RATE  Result Value Ref Range   POCT SED RATE 4 0 - 22 mm/hr        Assessment & Plan:   Cody Hall is a 54 y.o. male Chest pain, unspecified chest pain type - Plan: EKG 12-Lead, POCT CBC, POCT SEDIMENTATION  RATE, D-dimer, quantitative, DG Chest 2 View, DG Cervical Spine 2 or 3 views  Upper back pain on right side - Plan: DG Cervical Spine 2 or 3 views  Shortness of breath - Plan: D-dimer, quantitative, DG Chest 2 View  Pain in joint, shoulder region, right - Plan: DG Cervical Spine 2 or 3 views  Neck tightness - Plan: DG Cervical Spine 2 or 3 views  Cervical radiculopathy at C8 - Plan: metaxalone (SKELAXIN) 800 MG tablet  Suspected cervical radiculopathy from lower C spine/DDD. Less likely PE, infection or pericarditis, but ESR and D dimer obtained - both normal range/negative. Less likely cardiac in nature without apparent changes on EKG.   -trial of skelaxin (intoleratnt to flexeril in past - RTC precautions if any new symptoms with this)  -otc advil if needed.   -rtc precautions if pain persists, and ER/911 chest pain precautions discussed.     Meds ordered this encounter  Medications  . metaxalone (SKELAXIN) 800 MG tablet    Sig: Take 1 tablet (800 mg total) by mouth 3 (three) times daily as needed for muscle spasms.    Dispense:  30 tablet    Refill:  0   Patient Instructions  Your xray indicates possible pinched nerve from lower neck. I did not see any concerns on your blood work or chest xray, but test for inflammation and blood clot are still not back.  Those will be back later tonight and if elevated - I will call you to discuss other testing if needed.   Try the skelaxin three times per day as needed for possible pinched nerve and muscle spasm, advil over the counter if needed (stop if stomach upset). If not improving in next 5 days - return for recheck. Return to the clinic or go to the nearest emergency room if any of your symptoms  worsen or new symptoms occur.  If any return of chest pain - be seen here or emergency room as discussed.   Cervical Radiculopathy Cervical radiculopathy happens when a nerve in the neck is pinched or bruised by a slipped (herniated) disk or by arthritic changes in the bones of the cervical spine. This can occur due to an injury or as part of the normal aging process. Pressure on the cervical nerves can cause pain or numbness that runs from your neck all the way down into your arm and fingers. CAUSES  There are many possible causes, including:  Injury.  Muscle tightness in the neck from overuse.  Swollen, painful joints (arthritis).  Breakdown or degeneration in the bones and joints of the spine (spondylosis) due to aging.  Bone spurs that may develop near the cervical nerves. SYMPTOMS  Symptoms include pain, weakness, or numbness in the affected arm and hand. Pain can be severe or irritating. Symptoms may be worse when extending or turning the neck. DIAGNOSIS  Your caregiver will ask about your symptoms and do a physical exam. He or she may test your strength and reflexes. X-rays, CT scans, and MRI scans may be needed in cases of injury or if the symptoms do not go away after a period of time. Electromyography (EMG) or nerve conduction testing may be done to study how your nerves and muscles are working. TREATMENT  Your caregiver may recommend certain exercises to help relieve your symptoms. Cervical radiculopathy can, and often does, get better with time and treatment. If your problems continue, treatment options may include:  Wearing a soft collar for short periods of  time.  Physical therapy to strengthen the neck muscles.  Medicines, such as nonsteroidal anti-inflammatory drugs (NSAIDs), oral corticosteroids, or spinal injections.  Surgery. Different types of surgery may be done depending on the cause of your problems. HOME CARE INSTRUCTIONS   Put ice on the affected  area.  Put ice in a plastic bag.  Place a towel between your skin and the bag.  Leave the ice on for 15-20 minutes, 03-04 times a day or as directed by your caregiver.  If ice does not help, you can try using heat. Take a warm shower or bath, or use a hot water bottle as directed by your caregiver.  You may try a gentle neck and shoulder massage.  Use a flat pillow when you sleep.  Only take over-the-counter or prescription medicines for pain, discomfort, or fever as directed by your caregiver.  If physical therapy was prescribed, follow your caregiver's directions.  If a soft collar was prescribed, use it as directed. SEEK IMMEDIATE MEDICAL CARE IF:   Your pain gets much worse and cannot be controlled with medicines.  You have weakness or numbness in your hand, arm, face, or leg.  You have a high fever or a stiff, rigid neck.  You lose bowel or bladder control (incontinence).  You have trouble with walking, balance, or speaking. MAKE SURE YOU:   Understand these instructions.  Will watch your condition.  Will get help right away if you are not doing well or get worse. Document Released: 09/22/2000 Document Revised: 03/22/2011 Document Reviewed: 08/11/2010 [facility] Patient Information 2015 [place], [place]. This information is not intended to replace advice given to you by your health care provider. Make sure you discuss any questions you have with your health care provider.     I personally performed the services described in this documentation, which was scribed in my presence. The recorded information has been reviewed and considered, and addended by me as needed.

## 2014-10-15 ENCOUNTER — Encounter: Payer: Self-pay | Admitting: Emergency Medicine

## 2015-09-21 IMAGING — CR DG CHEST 2V
2 series · 2 of 2 positions shown · non-contrast
Comparison: 03/31/2012

CLINICAL DATA: Chest pain x3 weeks

EXAM:
CHEST  2 VIEW

[PA]
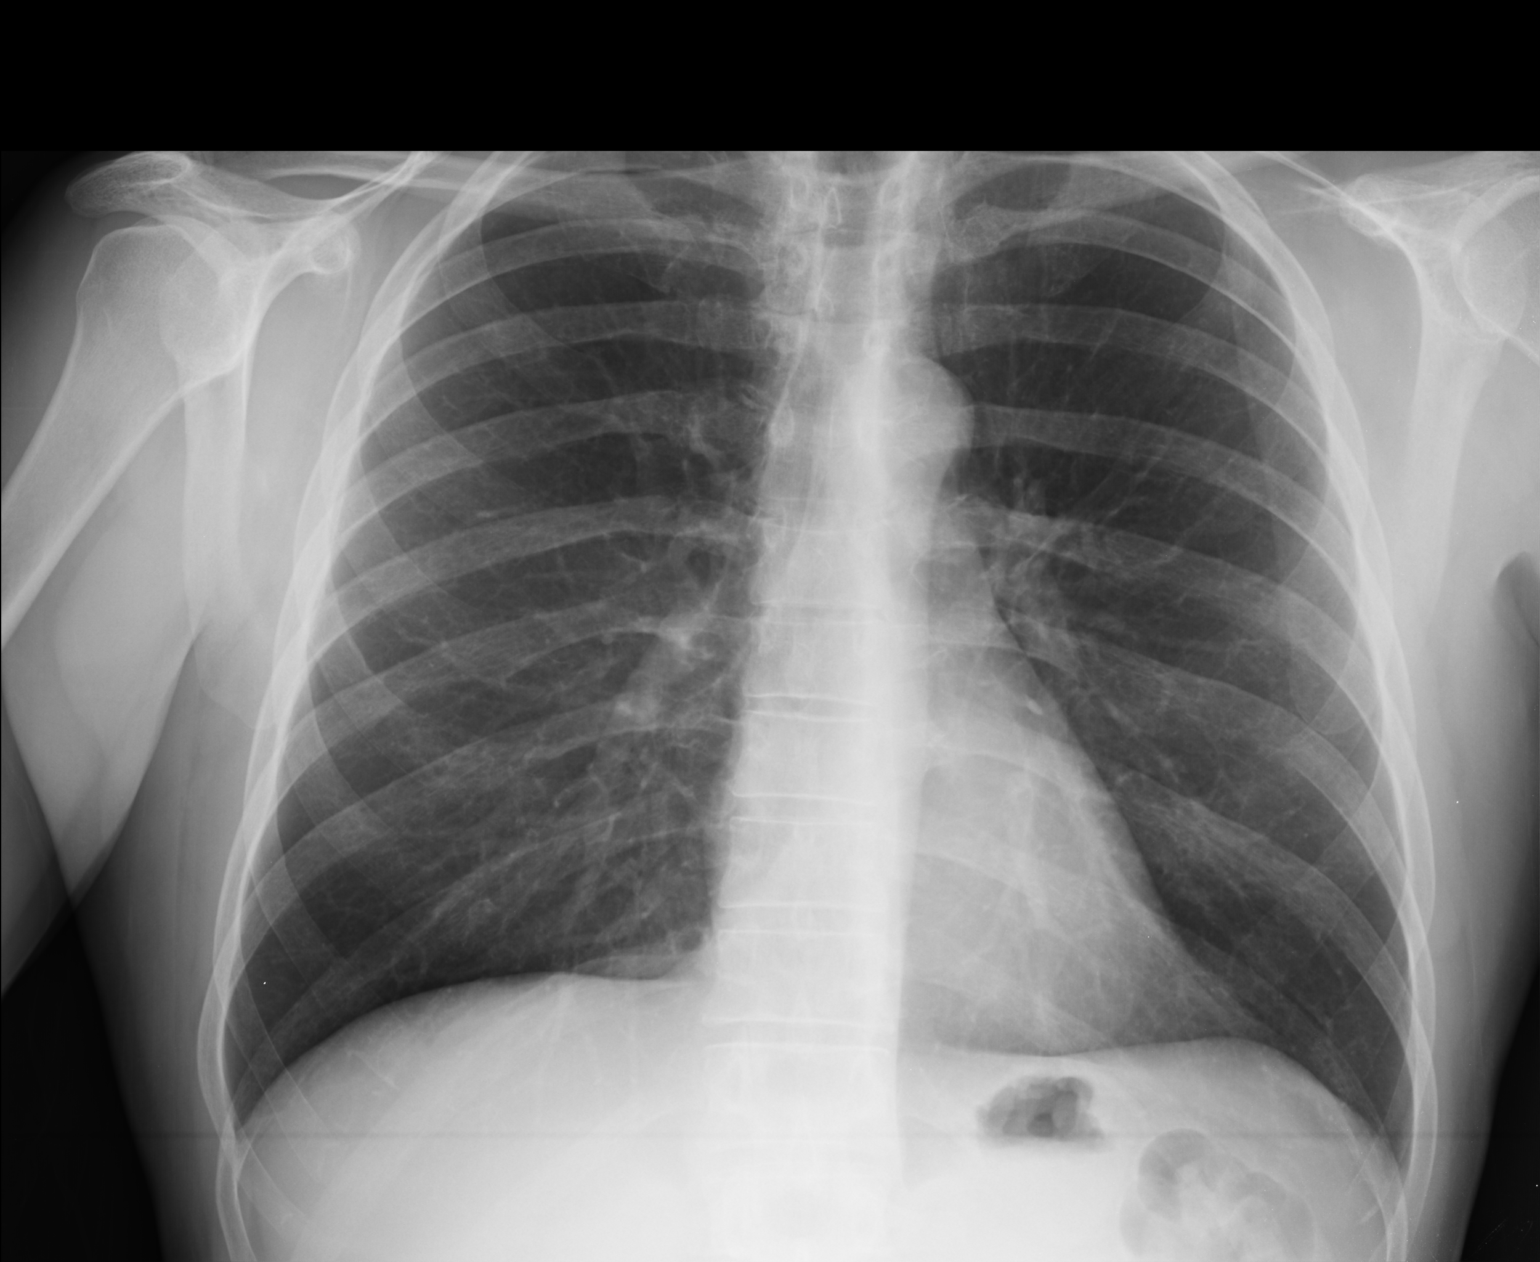

[lateral]
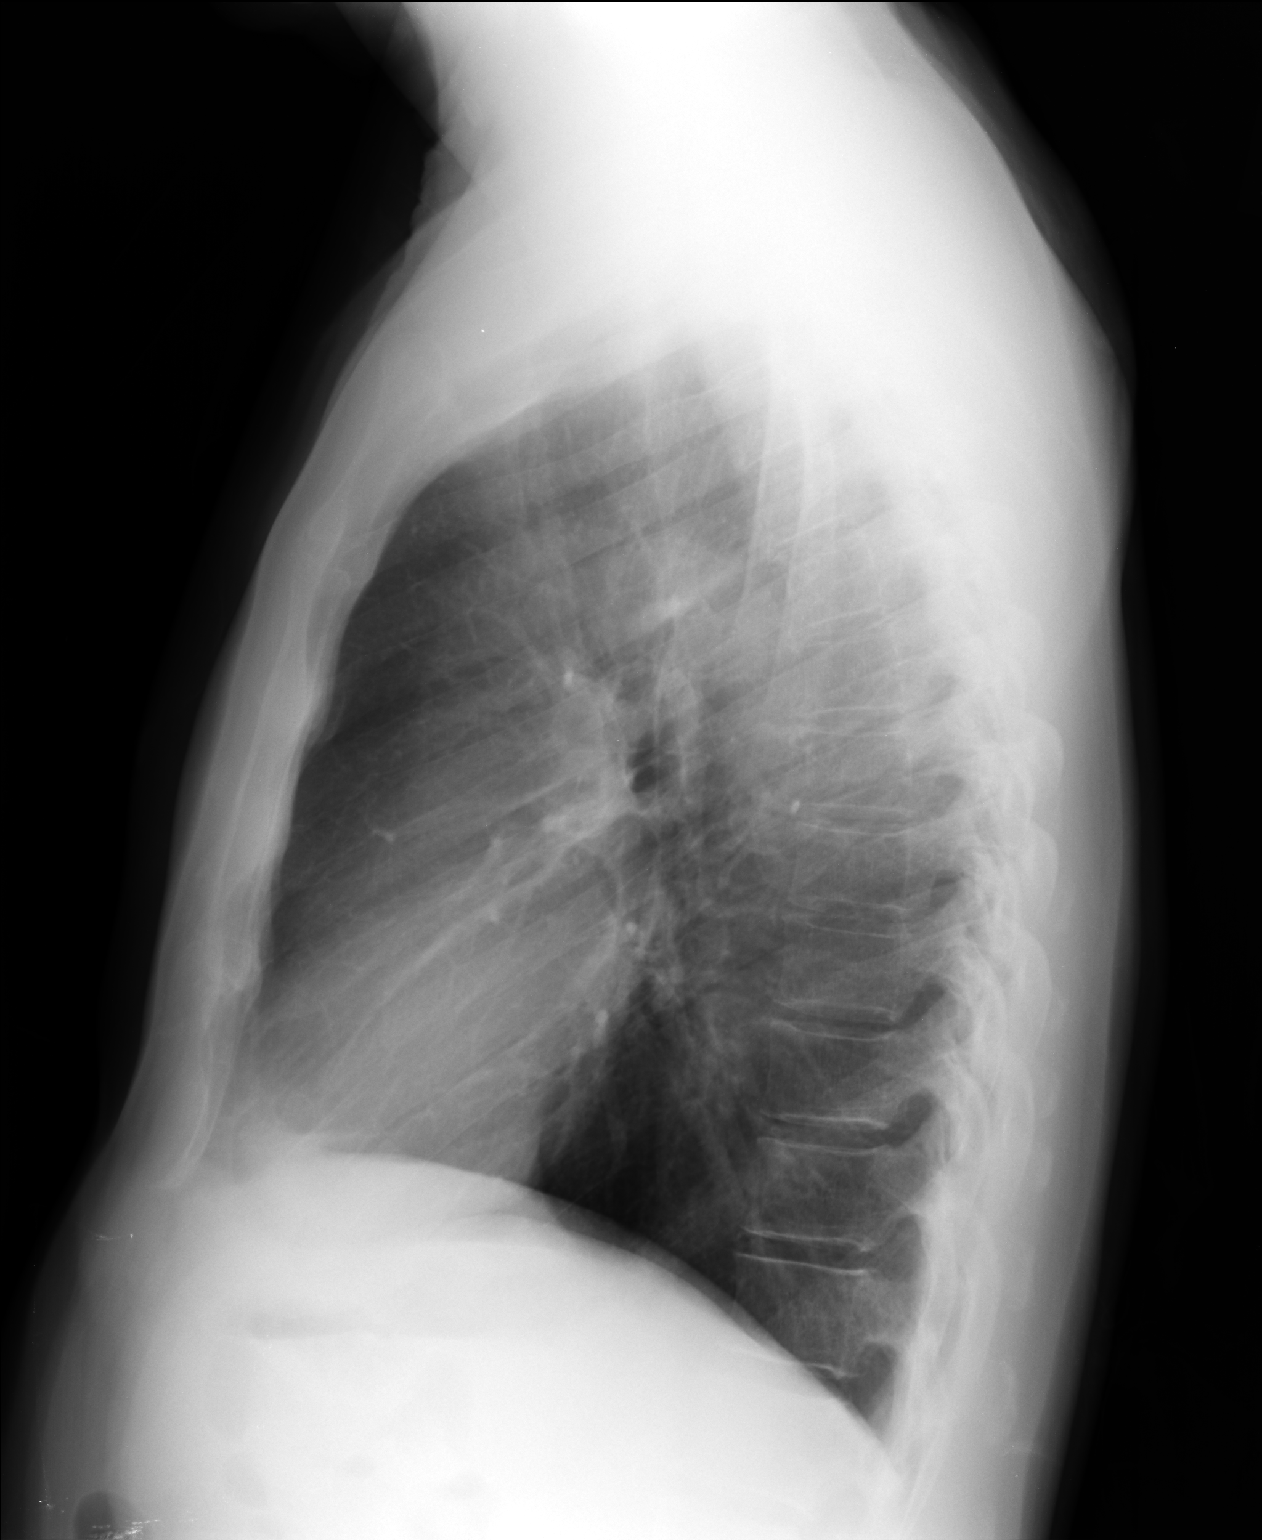

[2 of 2 positions shown; findings below may reference images not displayed]

FINDINGS: Lungs are clear.  No pleural effusion or pneumothorax.

The heart is normal in size.

Visualized osseous structures are within normal limits.
IMPRESSION: Normal chest radiographs.

## 2016-01-21 DIAGNOSIS — M9901 Segmental and somatic dysfunction of cervical region: Secondary | ICD-10-CM | POA: Diagnosis not present

## 2016-01-21 DIAGNOSIS — M542 Cervicalgia: Secondary | ICD-10-CM | POA: Diagnosis not present

## 2016-01-21 DIAGNOSIS — M9902 Segmental and somatic dysfunction of thoracic region: Secondary | ICD-10-CM | POA: Diagnosis not present

## 2016-02-16 DIAGNOSIS — M542 Cervicalgia: Secondary | ICD-10-CM | POA: Diagnosis not present

## 2016-02-16 DIAGNOSIS — M9902 Segmental and somatic dysfunction of thoracic region: Secondary | ICD-10-CM | POA: Diagnosis not present

## 2016-02-16 DIAGNOSIS — M9901 Segmental and somatic dysfunction of cervical region: Secondary | ICD-10-CM | POA: Diagnosis not present

## 2016-03-11 DIAGNOSIS — M542 Cervicalgia: Secondary | ICD-10-CM | POA: Diagnosis not present

## 2016-03-11 DIAGNOSIS — M9902 Segmental and somatic dysfunction of thoracic region: Secondary | ICD-10-CM | POA: Diagnosis not present

## 2016-03-11 DIAGNOSIS — M9901 Segmental and somatic dysfunction of cervical region: Secondary | ICD-10-CM | POA: Diagnosis not present

## 2016-04-06 DIAGNOSIS — M9901 Segmental and somatic dysfunction of cervical region: Secondary | ICD-10-CM | POA: Diagnosis not present

## 2016-04-06 DIAGNOSIS — M542 Cervicalgia: Secondary | ICD-10-CM | POA: Diagnosis not present

## 2016-04-06 DIAGNOSIS — M9902 Segmental and somatic dysfunction of thoracic region: Secondary | ICD-10-CM | POA: Diagnosis not present

## 2016-05-10 DIAGNOSIS — M542 Cervicalgia: Secondary | ICD-10-CM | POA: Diagnosis not present

## 2016-05-10 DIAGNOSIS — M9901 Segmental and somatic dysfunction of cervical region: Secondary | ICD-10-CM | POA: Diagnosis not present

## 2016-05-10 DIAGNOSIS — M9902 Segmental and somatic dysfunction of thoracic region: Secondary | ICD-10-CM | POA: Diagnosis not present

## 2016-06-03 DIAGNOSIS — M542 Cervicalgia: Secondary | ICD-10-CM | POA: Diagnosis not present

## 2016-06-03 DIAGNOSIS — M9901 Segmental and somatic dysfunction of cervical region: Secondary | ICD-10-CM | POA: Diagnosis not present

## 2016-06-03 DIAGNOSIS — M9902 Segmental and somatic dysfunction of thoracic region: Secondary | ICD-10-CM | POA: Diagnosis not present

## 2016-06-21 DIAGNOSIS — M542 Cervicalgia: Secondary | ICD-10-CM | POA: Diagnosis not present

## 2016-06-21 DIAGNOSIS — M9902 Segmental and somatic dysfunction of thoracic region: Secondary | ICD-10-CM | POA: Diagnosis not present

## 2016-06-21 DIAGNOSIS — M9901 Segmental and somatic dysfunction of cervical region: Secondary | ICD-10-CM | POA: Diagnosis not present

## 2016-07-07 DIAGNOSIS — M9901 Segmental and somatic dysfunction of cervical region: Secondary | ICD-10-CM | POA: Diagnosis not present

## 2016-07-07 DIAGNOSIS — M9902 Segmental and somatic dysfunction of thoracic region: Secondary | ICD-10-CM | POA: Diagnosis not present

## 2016-07-07 DIAGNOSIS — M542 Cervicalgia: Secondary | ICD-10-CM | POA: Diagnosis not present

## 2016-08-02 DIAGNOSIS — M542 Cervicalgia: Secondary | ICD-10-CM | POA: Diagnosis not present

## 2016-08-02 DIAGNOSIS — M9901 Segmental and somatic dysfunction of cervical region: Secondary | ICD-10-CM | POA: Diagnosis not present

## 2016-08-02 DIAGNOSIS — M9902 Segmental and somatic dysfunction of thoracic region: Secondary | ICD-10-CM | POA: Diagnosis not present

## 2016-08-18 DIAGNOSIS — M9902 Segmental and somatic dysfunction of thoracic region: Secondary | ICD-10-CM | POA: Diagnosis not present

## 2016-08-18 DIAGNOSIS — M542 Cervicalgia: Secondary | ICD-10-CM | POA: Diagnosis not present

## 2016-08-18 DIAGNOSIS — M9901 Segmental and somatic dysfunction of cervical region: Secondary | ICD-10-CM | POA: Diagnosis not present

## 2016-09-21 DIAGNOSIS — M9902 Segmental and somatic dysfunction of thoracic region: Secondary | ICD-10-CM | POA: Diagnosis not present

## 2016-09-21 DIAGNOSIS — M542 Cervicalgia: Secondary | ICD-10-CM | POA: Diagnosis not present

## 2016-09-21 DIAGNOSIS — M9901 Segmental and somatic dysfunction of cervical region: Secondary | ICD-10-CM | POA: Diagnosis not present

## 2016-10-14 DIAGNOSIS — M9901 Segmental and somatic dysfunction of cervical region: Secondary | ICD-10-CM | POA: Diagnosis not present

## 2016-10-14 DIAGNOSIS — M9902 Segmental and somatic dysfunction of thoracic region: Secondary | ICD-10-CM | POA: Diagnosis not present

## 2016-10-14 DIAGNOSIS — M542 Cervicalgia: Secondary | ICD-10-CM | POA: Diagnosis not present

## 2016-11-10 DIAGNOSIS — M542 Cervicalgia: Secondary | ICD-10-CM | POA: Diagnosis not present

## 2016-11-10 DIAGNOSIS — M9902 Segmental and somatic dysfunction of thoracic region: Secondary | ICD-10-CM | POA: Diagnosis not present

## 2016-11-10 DIAGNOSIS — M9901 Segmental and somatic dysfunction of cervical region: Secondary | ICD-10-CM | POA: Diagnosis not present

## 2016-12-06 DIAGNOSIS — M9902 Segmental and somatic dysfunction of thoracic region: Secondary | ICD-10-CM | POA: Diagnosis not present

## 2016-12-06 DIAGNOSIS — M542 Cervicalgia: Secondary | ICD-10-CM | POA: Diagnosis not present

## 2016-12-06 DIAGNOSIS — M9901 Segmental and somatic dysfunction of cervical region: Secondary | ICD-10-CM | POA: Diagnosis not present

## 2016-12-30 DIAGNOSIS — M542 Cervicalgia: Secondary | ICD-10-CM | POA: Diagnosis not present

## 2016-12-30 DIAGNOSIS — M9902 Segmental and somatic dysfunction of thoracic region: Secondary | ICD-10-CM | POA: Diagnosis not present

## 2016-12-30 DIAGNOSIS — M9901 Segmental and somatic dysfunction of cervical region: Secondary | ICD-10-CM | POA: Diagnosis not present

## 2017-01-05 ENCOUNTER — Emergency Department (HOSPITAL_BASED_OUTPATIENT_CLINIC_OR_DEPARTMENT_OTHER): Payer: Federal, State, Local not specified - PPO

## 2017-01-05 ENCOUNTER — Other Ambulatory Visit: Payer: Self-pay

## 2017-01-05 ENCOUNTER — Observation Stay (HOSPITAL_BASED_OUTPATIENT_CLINIC_OR_DEPARTMENT_OTHER): Payer: Federal, State, Local not specified - PPO

## 2017-01-05 ENCOUNTER — Observation Stay (HOSPITAL_BASED_OUTPATIENT_CLINIC_OR_DEPARTMENT_OTHER)
Admission: EM | Admit: 2017-01-05 | Discharge: 2017-01-06 | Disposition: A | Discharge status: Home / Self Care | Payer: Federal, State, Local not specified - PPO | Attending: Internal Medicine | Admitting: Internal Medicine

## 2017-01-05 ENCOUNTER — Encounter (HOSPITAL_BASED_OUTPATIENT_CLINIC_OR_DEPARTMENT_OTHER): Payer: Self-pay | Admitting: Emergency Medicine

## 2017-01-05 DIAGNOSIS — Z8673 Personal history of transient ischemic attack (TIA), and cerebral infarction without residual deficits: Secondary | ICD-10-CM | POA: Diagnosis not present

## 2017-01-05 DIAGNOSIS — T50905A Adverse effect of unspecified drugs, medicaments and biological substances, initial encounter: Secondary | ICD-10-CM | POA: Diagnosis not present

## 2017-01-05 DIAGNOSIS — Z79899 Other long term (current) drug therapy: Secondary | ICD-10-CM | POA: Diagnosis not present

## 2017-01-05 DIAGNOSIS — Z7982 Long term (current) use of aspirin: Secondary | ICD-10-CM | POA: Insufficient documentation

## 2017-01-05 DIAGNOSIS — I4891 Unspecified atrial fibrillation: Secondary | ICD-10-CM

## 2017-01-05 DIAGNOSIS — R0602 Shortness of breath: Secondary | ICD-10-CM | POA: Insufficient documentation

## 2017-01-05 DIAGNOSIS — R001 Bradycardia, unspecified: Secondary | ICD-10-CM | POA: Insufficient documentation

## 2017-01-05 DIAGNOSIS — T461X5A Adverse effect of calcium-channel blockers, initial encounter: Secondary | ICD-10-CM | POA: Insufficient documentation

## 2017-01-05 DIAGNOSIS — R05 Cough: Secondary | ICD-10-CM | POA: Diagnosis not present

## 2017-01-05 HISTORY — DX: Unspecified atrial fibrillation: I48.91

## 2017-01-05 LAB — TSH
TSH: 1.921 u[IU]/mL (ref 0.350–4.500)
TSH: 1.271 u[IU]/mL (ref 0.350–4.500)

## 2017-01-05 LAB — CBC WITH DIFFERENTIAL/PLATELET
Basophils Absolute: 0 10*3/uL (ref 0.0–0.1)
Basophils Relative: 0 %
Eosinophils Absolute: 0 10*3/uL (ref 0.0–0.7)
Eosinophils Relative: 1 %
HCT: 43.8 % (ref 39.0–52.0)
Hemoglobin: 14.6 g/dL (ref 13.0–17.0)
Lymphocytes Relative: 15 %
Lymphs Abs: 0.5 10*3/uL — ABNORMAL LOW (ref 0.7–4.0)
MCH: 29.4 pg (ref 26.0–34.0)
MCHC: 33.3 g/dL (ref 30.0–36.0)
MCV: 88.1 fL (ref 78.0–100.0)
Monocytes Absolute: 0.6 10*3/uL (ref 0.1–1.0)
Monocytes Relative: 16 %
Neutro Abs: 2.5 10*3/uL (ref 1.7–7.7)
Neutrophils Relative %: 68 %
Platelets: 105 10*3/uL — ABNORMAL LOW (ref 150–400)
RBC: 4.97 MIL/uL (ref 4.22–5.81)
RDW: 13.3 % (ref 11.5–15.5)
WBC: 3.6 10*3/uL — ABNORMAL LOW (ref 4.0–10.5)

## 2017-01-05 LAB — ECHOCARDIOGRAM COMPLETE
Height: 68 in
Weight: 2481.6 oz

## 2017-01-05 LAB — BASIC METABOLIC PANEL
Anion gap: 10 (ref 5–15)
BUN: 16 mg/dL (ref 6–20)
CO2: 24 mmol/L (ref 22–32)
Calcium: 9 mg/dL (ref 8.9–10.3)
Chloride: 104 mmol/L (ref 101–111)
Creatinine, Ser: 0.87 mg/dL (ref 0.61–1.24)
GFR calc Af Amer: >60 mL/min (ref >60)
GFR calc non Af Amer: >60 mL/min (ref >60)
Glucose, Bld: 106 mg/dL — ABNORMAL HIGH (ref 65–99)
Potassium: 4.1 mmol/L (ref 3.5–5.1)
Sodium: 138 mmol/L (ref 135–145)

## 2017-01-05 LAB — TROPONIN I: Troponin I: <0.03 ng/mL (ref <0.03)

## 2017-01-05 LAB — BRAIN NATRIURETIC PEPTIDE: B Natriuretic Peptide: 30.7 pg/mL (ref 0.0–100.0)

## 2017-01-05 LAB — MAGNESIUM: Magnesium: 2 mg/dL (ref 1.7–2.4)

## 2017-01-05 MED ORDER — SODIUM CHLORIDE 0.9 % IV SOLN
INTRAVENOUS | Status: AC | PRN
  Administered 2017-01-05: 1000 mL via INTRAVENOUS

## 2017-01-05 MED ORDER — DILTIAZEM HCL 100 MG IV SOLR
5.0000 mg/h | INTRAVENOUS | Status: DC
  Filled 2017-01-05: qty 100

## 2017-01-05 MED ORDER — ACETAMINOPHEN 650 MG RE SUPP: 650.0000 mg | Freq: Four times a day (QID) | RECTAL | Status: DC | PRN

## 2017-01-05 MED ORDER — SODIUM CHLORIDE 0.9 % IV SOLN
1.0000 g | Freq: Once | INTRAVENOUS | Status: AC
  Administered 2017-01-05: 1 g via INTRAVENOUS

## 2017-01-05 MED ORDER — SODIUM CHLORIDE 0.9 % IV SOLN
INTRAVENOUS | Status: AC
  Administered 2017-01-05: 1 g via INTRAVENOUS
  Filled 2017-01-05: qty 10

## 2017-01-05 MED ORDER — ATROPINE SULFATE 1 MG/ML IJ SOLN
INTRAMUSCULAR | Status: AC | PRN
  Administered 2017-01-05: 1 mg via INTRAVENOUS

## 2017-01-05 MED ORDER — ACETAMINOPHEN 325 MG PO TABS: 650.0000 mg | ORAL_TABLET | Freq: Four times a day (QID) | ORAL | Status: DC | PRN

## 2017-01-05 MED ORDER — HEPARIN SODIUM (PORCINE) 5000 UNIT/ML IJ SOLN
5000.0000 [IU] | Freq: Three times a day (TID) | INTRAMUSCULAR | Status: DC
  Filled 2017-01-05: qty 1

## 2017-01-05 MED ORDER — SODIUM CHLORIDE 0.9 % IV BOLUS (SEPSIS)
3000.0000 mL | Freq: Once | INTRAVENOUS | Status: AC
  Administered 2017-01-05: 1000 mL via INTRAVENOUS

## 2017-01-05 MED ORDER — POLYETHYLENE GLYCOL 3350 17 G PO PACK: 17.0000 g | PACK | Freq: Every day | ORAL | Status: DC | PRN

## 2017-01-05 MED ORDER — BISACODYL 5 MG PO TBEC: 5.0000 mg | DELAYED_RELEASE_TABLET | Freq: Every day | ORAL | Status: DC | PRN

## 2017-01-05 MED ORDER — DILTIAZEM LOAD VIA INFUSION
20.0000 mg | Freq: Once | INTRAVENOUS | Status: AC
  Administered 2017-01-05: 20 mg via INTRAVENOUS
  Filled 2017-01-05: qty 20

## 2017-01-05 MED ORDER — APIXABAN 5 MG PO TABS
5.0000 mg | ORAL_TABLET | Freq: Two times a day (BID) | ORAL | Status: DC
  Administered 2017-01-05: 5 mg via ORAL
  Filled 2017-01-05 (×2): qty 1

## 2017-01-05 NOTE — ED Notes (Signed)
Report to [facility] staff at bedside. Pt is awake and alert, denies any c/o at this time.

## 2017-01-05 NOTE — H&P (Signed)
History and Physical  Cody Echevariaric Stange ZOX:[id number] DOB: Oct 01, 1960 DOA: 01/05/2017  Referring physician: Dr. Juleen ChinaKohut PCP: Collene Gobbleaub, Steven A, MD  Outpatient Specialists: none Patient coming from:Home At his baseline ambulates independently  Chief Complaint: heart palpitations  HPI: Cody Hall is a 56 y.o. male with medical history significant for *factor V Leiden who presents on 01/05/2017 is [facility] for numerous atrial fibrillation with RVR complicated by bradycardia.  Patient states his symptoms started this past Friday with generalized malaise and subjective chills.  He has been feeling poorly since a recent party with his wife.  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  He is working 12-13-hour shifts: With only 4-5 hours of sleep.  His symptoms seem to improve with increased rest: However, last night he awoke with palpitations.  He does endorse feeling some slight chest pressure prior to this current episode, was nonradiating, not associated with diaphoresis, nausea or vomiting.  He attempted to check his pulse and noted his rhythm is very irregular.  His evaluation in the ED  Of note, patient was evaluated in 2014 for acute onset amnesia and feeling "foggy".  He was worked up for a presumed TIA however dry negative as well as TTE and other vascular studies.  Hypercoagulable study showed positive for factor V Leiden.  He was started on full dose aspirin  He states he was evaluated by Dr. Excell Seltzerooper with cardiology in the past for palpitations and was thought to be benign.  Otherwise patient has no known medical conditions.  ED Course: In the ED is found to have atrial fibrillation 130s.  He was given a loading dose of diltiazem initially after starting diltiazem drip patient was noted to be bradycardic with heart rate of 37.  He was treated with atropine and calcium gluconate heart rate 70s-80s.  Patient was  [facility].    Review of Systems:As mentioned in the history of present illness.Review of systems are otherwise negative Patient seen on the floor .    Past Medical History:  Diagnosis Date  . Bronchospasm   . Chronic constipation   . CVA (cerebral vascular accident) (HCC)   . Elevated cholesterol    LDL  . GERD (gastroesophageal reflux disease)   . Low testosterone   . Other malaise and fatigue   . Reflux   . Seasonal allergies   . Tic    FACIAL   Past Surgical History:  Procedure Laterality Date  . APPENDECTOMY      Social History:  reports that  has never smoked. he has never used smokeless tobacco. He reports that he does not drink alcohol or use drugs.   Allergies  Allergen Reactions  . Flexeril [Cyclobenzaprine Hcl] Other (See Comments)    Unknown   . Lipitor [Atorvastatin]     Rash, muscle aches and chest pressure  . Meloxicam     AR   nasal congestion  . Penicillins     childhood    Family History  Problem Relation Age of Onset  . Ovarian cancer Mother   . Hyperlipidemia Father   . Hyperlipidemia Sister   . Colon cancer Neg Hx   . Colon polyps Neg Hx       Prior to Admission medications   Medication Sig Start Date End Date Taking? Authorizing Provider  aspirin 325 MG tablet Take 1 tablet (325 mg total) by mouth  daily. 03/18/12   Hairford, Ricki Miller, MD  Cholecalciferol (VITAMIN D3) 2000 UNITS TABS Take 1 tablet by mouth at bedtime.     [provider]  desloratadine (CLARINEX) 5 MG tablet Take 1 tablet (5 mg total) by mouth daily. Patient not taking: Reported on 05/29/2014 04/18/12   Collene Gobble, MD  DHEA 25 MG CAPS Take 1 capsule by mouth.    [provider]  metaxalone (SKELAXIN) 800 MG tablet Take 1 tablet (800 mg total) by mouth 3 (three) times daily as needed for muscle spasms. 05/29/14   Shade Flood, MD    Physical Exam: BP (!) 93/59   Pulse (!) 105   Temp 98.4 F (36.9 C) (Oral)    Resp (!) 23   Ht 5\' 8"  (1.727 m)   Wt 65.8 kg (145 lb)   SpO2 100%   BMI 22.05 kg/m   General: Lying in bed in Appear in no distress Eyes: EOMI, anicteric ENT: Oral Mucosa clear moist Neck: normal, supple, no masses, no JVD Cardiovascular: irregularly irregular rate , no appreciable Murmurs, rubs or gallops. Peripheral Pulses present.  No edema Respiratory: Normal respiratory effort, clear breath sounds on auscultation bilaterally, no rales wheezes or rhonchi Abdomen: soft, non-distended, non-tender, normal bowel sounds, no guarding or rebound tenderness Skin:  No Rash Musculoskeletal:,  No clubbing / cyanosis. No joint deformity upper and lower extremities. Good ROM, no contractures. Normal muscle tone Neurologic: Grossly no focal neuro deficit.Mental status AAOx3, speech normal, Psychiatric: Appropriate affect, and mood          Labs on Admission:  Basic Metabolic Panel: Recent Labs  Lab 01/05/17 0800  NA 138  K 4.1  CL 104  CO2 24  GLUCOSE 106*  BUN 16  CREATININE 0.87  CALCIUM 9.0  MG 2.0   Liver Function Tests: No results for input(s): AST, ALT, ALKPHOS, BILITOT, PROT, ALBUMIN in the last 168 hours. No results for input(s): LIPASE, AMYLASE in the last 168 hours. No results for input(s): AMMONIA in the last 168 hours. CBC: Recent Labs  Lab 01/05/17 0800  WBC 3.6*  NEUTROABS 2.5  HGB 14.6  HCT 43.8  MCV 88.1  PLT 105*   Cardiac Enzymes: Recent Labs  Lab 01/05/17 0800  TROPONINI <0.03    BNP (last 3 results) Recent Labs    01/05/17 0800  BNP 30.7    ProBNP (last 3 results) No results for input(s): PROBNP in the last 8760 hours.  CBG: No results for input(s): GLUCAP in the last 168 hours.  Radiological Exams on Admission: Dg Chest 2 View  Result Date: 01/05/2017 CLINICAL DATA:  Cough and fever.  Cardiac palpitations. EXAM: CHEST  2 VIEW COMPARISON:  May 29, 2014 FINDINGS: There is no edema or consolidation. The heart size and pulmonary  vascularity are normal. No adenopathy. No bone lesions. IMPRESSION: No edema or consolidation. Electronically Signed   By: Bretta Bang III M.D.   On: 01/05/2017 07:29    EKG: Independently reviewed. Atrial fibrillation.  Assessment/Plan Present on Admission: . Atrial fibrillation, new onset (HCC)  Active Problems:   Atrial fibrillation, new onset (HCC)  New-onset atrial fibrillation, currently rate controlled without medications Unclear nidus/etiology.  No further episodes of bradycardia (in setting of diltiazem administration) patient does endorse being under stress and poor sleep.  In addition recently sick.  Denies any new medications to contribute.  Initial concern for prior history of TIA however upon chart review in 2014 all imaging was negative the  patient was found to have factor V Leiden on hypercoagulable workup.  He was briefly on low-dose aspirin but no longer takes that -Cardiology consulted  - Monitor telemetry rate controlled no need for beta blockers or calcium blockers currently     DVT prophylaxis: Subcutaneous Heparin  Code Status: Full code  Family Communication:  Wife was present at bedside and updated appropriately at the time of interview.   Disposition Plan: rate control and anticoagulation for new AF   Consults called: Cardiology   Admission status: Admitted as observation telemetry unit.      Laverna PeaceShayla D Jager Koska MD [facility]  Pager [phone]  If 7PM-7AM, please contact night-coverage [email or web address] Password [email or web address]  01/05/2017, 11:50 AM

## 2017-01-05 NOTE — ED Provider Notes (Signed)
[facility] EMERGENCY DEPARTMENT Provider Note   CSN: [id number] Arrival date & time: 01/05/17  1191     History   Chief Complaint Chief Complaint  Patient presents with  . Cough    HPI Cody Hall is a 56 y.o. male.  HPI   56yM with cough and general malaise. Onset 1-2 weeks ago. Works for [employer] and has been working extended hours for holiday season. Feels like he has worked himself to exhaustion. Intermittent cough. Non-productive. Keeping him up at night. No fever. Mild dyspnea. No unusual leg pain or swelling. Last night noticed that heart was beating fast and irregular.   Past Medical History:  Diagnosis Date  . Bronchospasm   . Chronic constipation   . CVA (cerebral vascular accident) (HCC)   . Elevated cholesterol    LDL  . GERD (gastroesophageal reflux disease)   . Low testosterone   . Other malaise and fatigue   . Reflux   . Seasonal allergies   . Tic    FACIAL    Patient Active Problem List   Diagnosis Date Noted  . Acute confusional state 05/02/2012  . Factor 5 Leiden mutation, heterozygous (HCC) 03/22/2012  . CVA (cerebral infarction) 03/17/2012  . Hyperlipidemia LDL goal < 100 03/17/2012  . Amnesia 03/17/2012  . Reflux   . Seasonal allergies   . Testosterone deficiency     Past Surgical History:  Procedure Laterality Date  . APPENDECTOMY         Home Medications    Prior to Admission medications   Medication Sig Start Date End Date Taking? Authorizing Provider  aspirin 325 MG tablet Take 1 tablet (325 mg total) by mouth daily. 03/18/12   Hairford, Ricki Miller, MD  Cholecalciferol (VITAMIN D3) 2000 UNITS TABS Take 1 tablet by mouth at bedtime.     [provider]  desloratadine (CLARINEX) 5 MG tablet Take 1 tablet (5 mg total) by mouth daily. Patient not taking: Reported on 05/29/2014 04/18/12   Collene Gobble, MD  DHEA 25 MG CAPS Take 1 capsule by mouth.    [provider]  metaxalone (SKELAXIN) 800 MG  tablet Take 1 tablet (800 mg total) by mouth 3 (three) times daily as needed for muscle spasms. 05/29/14   Shade Flood, MD    Family History Family History  Problem Relation Age of Onset  . Ovarian cancer Mother   . Hyperlipidemia Father   . Hyperlipidemia Sister   . Colon cancer Neg Hx   . Colon polyps Neg Hx     Social History Social History   Tobacco Use  . Smoking status: Never Smoker  . Smokeless tobacco: Never Used  Substance Use Topics  . Alcohol use: No  . Drug use: No     Allergies   Flexeril [cyclobenzaprine hcl]; Lipitor [atorvastatin]; Meloxicam; and Penicillins   Review of Systems Review of Systems  All systems reviewed and negative, other than as noted in HPI.  Physical Exam Updated Vital Signs BP (!) 115/94 (BP Location: Right Arm)   Pulse 76   Temp 98.4 F (36.9 C) (Oral)   Resp 18   Ht 5\' 8"  (1.727 m)   Wt 65.8 kg (145 lb)   SpO2 100%   BMI 22.05 kg/m   Physical Exam  Constitutional: He appears well-developed and well-nourished. No distress.  HENT:  Head: Normocephalic and atraumatic.  Eyes: Conjunctivae are normal. Right eye exhibits no discharge. Left eye exhibits no discharge.  Neck: Neck  supple.  Cardiovascular: Normal heart sounds. Exam reveals no gallop and no friction rub.  No murmur heard. Tachycardic. irreg irreg. No murmur.   Pulmonary/Chest: Effort normal and breath sounds normal. No respiratory distress.  Abdominal: Soft. He exhibits no distension. There is no tenderness.  Musculoskeletal: He exhibits no edema or tenderness.  Lower extremities symmetric as compared to each other. No calf tenderness. Negative Homan's. No palpable cords.   Neurological: He is alert.  Skin: Skin is warm and dry.  Psychiatric: He has a normal mood and affect. His behavior is normal. Thought content normal.  Nursing note and vitals reviewed.    ED Treatments / Results  Labs (all labs ordered are listed, but only abnormal results are  displayed) Labs Reviewed  CBC WITH DIFFERENTIAL/PLATELET - Abnormal; Notable for the following components:      Result Value   WBC 3.6 (*)    Platelets 105 (*)    Lymphs Abs 0.5 (*)    All other components within normal limits  BASIC METABOLIC PANEL - Abnormal; Notable for the following components:   Glucose, Bld 106 (*)    All other components within normal limits  BRAIN NATRIURETIC PEPTIDE  TROPONIN I  MAGNESIUM  TSH    EKG  EKG Interpretation  Date/Time:  Wednesday January 05 2017 08:37:36 EST Ventricular Rate:  71 PR Interval:    QRS Duration: 79 QT Interval:  386 QTC Calculation: 420 R Axis:   53 Text Interpretation:  Atrial fibrillation Confirmed by Raeford RazorKohut, Rosaly Labarbera [phone]([id number]) on 01/05/2017 9:16:45 AM       Radiology Dg Chest 2 View  Result Date: 01/05/2017 CLINICAL DATA:  Cough and fever.  Cardiac palpitations. EXAM: CHEST  2 VIEW COMPARISON:  May 29, 2014 FINDINGS: There is no edema or consolidation. The heart size and pulmonary vascularity are normal. No adenopathy. No bone lesions. IMPRESSION: No edema or consolidation. Electronically Signed   By: Bretta BangWilliam  Woodruff III M.D.   On: 01/05/2017 07:29    Procedures Procedures (including critical care time)  CRITICAL CARE Performed by: Raeford RazorStephen Rodolph Hagemann Total critical care time: 35 minutes Critical care time was exclusive of separately billable procedures and treating other patients. Critical care was necessary to treat or prevent imminent or life-threatening deterioration. Critical care was time spent personally by me on the following activities: development of treatment plan with patient and/or surrogate as well as nursing, discussions with consultants, evaluation of patient's response to treatment, examination of patient, obtaining history from patient or surrogate, ordering and performing treatments and interventions, ordering and review of laboratory studies, ordering and review of radiographic studies, pulse oximetry  and re-evaluation of patient's condition.   Medications Ordered in ED Medications - No data to display   Initial Impression / Assessment and Plan / ED Course  I have reviewed the triage vital signs and the nursing notes.  Pertinent labs & imaging results that were available during my care of the patient were reviewed by me and considered in my medical decision making (see chart for details).  Clinical Course as of Jan 06 947  Wed Jan 05, 2017  [id number] Called to pt's room. Became bradycardic to 30s and hypotensive essentially after 20mg  cardizem. Was then started on gtt at 5 mg/hr but had been on it less than an hour. When I arrived to room he appeared ill. Nursing had turned cardizem off and was bolusing him IVF. Responding to me verbally but saying his "vision is going white." Pulse thready. BP 50s systolic. Monitor  with afib with rate in 40s. Code cart was brought to bedside. Additional IV established. He was given atropine x1, 1g calcium gluconate and additional IVF. Seemed to respond to atropine with increasing HR ~30 seconds after pushed. HR to 70-80s and BP to 90s systolic. Pacer pads placed as precaution.   [SK]  0911 HR still remaining in 70-90s. Last BP 93/68. He is feeling better, but at this point will admit for observation. Discussed with cardiology, Dr Allyson SabalBerry. Pt has been seen by Dr Excell Seltzerooper several years ago after stroke but not since that time. Requesting medical admission but happy to consult.   [SK]  0946 HR 70-90s. Last BP 87/57. Continue to monitor. [facility]   [SK]    Clinical Course User Index [SK] Raeford RazorKohut, Zosia Lucchese, MD    [phone] with over a week of URI symptoms and general malaise and then palpitations since last night. New onset afib. When discussing treatment options including ED cardioversion, he then wasn't quite so sure that palpitations just began last night and hadn't been having them intermittently for the past couple weeks. He seems very analytical and I'm  not sure how much he may be over thinking his symptoms. I recommended that if he isn't confident of the time of onset though then initiating rate control and anticoagulation from the ED with outpt cardiology follow-up.   He has a distant history of palpitations and cardiology evaluation after a stroke several years ago. He wore an event monitor which did not capture any significant abnormalities. ECHO in 2014 was normal. No known CAD. No known underlying lung disease. Never smoker. Denies significant ETOH. He is heterozygous for factor V Leiden and takes aspirin.   Aside for his HR, his vitals are normal. No respiratory distress. He sounds clear on exam. CXR w/o acute abnormality. No signs/symptoms of DVT. Clinically is not volume overloaded.   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  Will check electrolytes/TSH. Cardizem for rate control and reassessment.    Final Clinical Impressions(s) / ED Diagnoses   Final diagnoses:  New onset a-fib [facility](HCC)  Drug-induced bradycardia    ED Discharge Orders    None       Raeford RazorKohut, Semya Klinke, MD 01/05/17 [phone]

## 2017-01-05 NOTE — Plan of Care (Signed)
Pt up ad lib. Ambulates independently with ease. Endorses dyspnea/fatigue with exertion. Educated on use of call light system. Verbalizes understanding of need to call for assistance prior to ambulation when necessary

## 2017-01-05 NOTE — Consult Note (Signed)
Cardiology Consultation:   Patient ID: Cody Hall; [id number]; 1960/07/03   Admit date: 01/05/2017 Date of Consult: 01/05/2017  Primary Care Provider: Patient, No Pcp Per Primary Cardiologist: New (last seen in 2014)   Patient Profile:   Cody Hall is a 56 y.o. male heterozygote for factor V Leiden with a questionable hx of cryptogenic stroke in 2014 w/ 30 day monitor showing no atrial arrhthymias at that time, who is being seen today for the evaluation of newly diagnosed atrial fibrillation and bradycardia, at the request of Dr. Mal Misty, Internal Medicine.   History of Present Illness:   As outlined above, pt was evaluated in 2014 after being admitted for, what was felt to be at the time, cryptogenic stroke. Surface echocardiogram showed no cardiac source. EF was normal. No arrhthymias were detected on telemetry. He was further evaluated with a 30 day monitor which was benign with SR and no atrial arrhthymias.  A hypercoagulable panel was obtained. He is a heterozygote for factor V  Leiden. His homocystine and anticardiolipin antibodies were normal/negative. Antithrombin III was normal. Protein C level was normal but protein C activity was elevated. Protein S. antigen was elevated. No protein deficiency was noted. He was started on ASA therapy. Statin therapy was also initiated however pt did not tolerate. Statin was discontinued after he developed profound weakness and diffuse muscle aches. He was seen by Dr. Excell Seltzer who recommended that he f/u with Dr. Pearlean Brownie to determine need for TEE. This was ordered, however pt failed to get study done. Pt was lost to f/u. Not seen by our practice since 2014. On further questioning today, pt states that after f/u with Dr. Pearlean Brownie and additional neurologic testing, it was felt that perhaps he did not have a stroke. Repeat imaging was negative for infarct, per pt report.  Pt presented to [facility] earlier this morning with complaint of generalized  weakness and fast and irregular HR.  There, he was found to be in atrial fibrillation w/ RVR in the 130s. He was given a bolus of IV Cardizem (20 mg ) and subsequently developed bradycardia with rate in the 30s and hypotension, requiring atropine, calcium gluconate and IVFs. HR stabilized and pt transferred to [facility] for further management.    On arrival to [facility], EKG showed continued afib. H/H, K, Mg, TSH and SCr/BUN all within normal limits. K is 4.1 and Mg 2.0.  CXR negative. Troponin negative. BNP also normal at 30.   Pt remains in afib w/ CVR in the 80s on tele. BP is soft but stable in the 90s systolic. He is asymptomatic at rest. RN notes HR increases when he gets up to use the restroom. He notes recent history of flu like symptoms/ URI that started last week. He has taking OTC cold medicine. He did not get a flu shot this year. He also notes long work hours and increased work related stress. He works for the [employer] and has been working 12-13 hr days this holiday season. Very stressful. Little sleep. He has been staying well hydrated. No caffeine or ETOH. His wife notes h/o snoring and some apneic pauses. He has never had a sleep test to screen for OSA. He denies any significant chest pain or dyspnea. No exertional symptoms, other than recent fatigue.   Past Medical History:  Diagnosis Date  . Bronchospasm   . Chronic constipation   . CVA (cerebral vascular accident) (HCC) 2014   "vs [employer] amnesia; never had either one according  to Dr. Pearlean Hall" (01/05/2017)  . Elevated cholesterol    LDL  . GERD (gastroesophageal reflux disease)   . Low testosterone   . New onset atrial fibrillation (HCC) 01/05/2017  . Other malaise and fatigue   . Seasonal allergies   . Tic    FACIAL    Past Surgical History:  Procedure Laterality Date  . APPENDECTOMY       Home Medications:  Prior to Admission medications   Medication Sig Start Date End Date Taking? Authorizing Provider  aspirin 325  MG tablet Take 1 tablet (325 mg total) by mouth daily. Patient not taking: Reported on 01/05/2017 03/18/12   Hairford, Ricki MillerAmber M, MD    Inpatient Medications: Scheduled Meds: . heparin  5,000 Units Subcutaneous Q8H   Continuous Infusions:  PRN Meds: acetaminophen **OR** acetaminophen, bisacodyl, polyethylene glycol  Allergies:    Allergies  Allergen Reactions  . Flexeril [Cyclobenzaprine Hcl] Other (See Comments)    Unknown   . Lipitor [Atorvastatin]     Rash, muscle aches and chest pressure  . Meloxicam     AR   nasal congestion  . Penicillins     childhood    Social History:   Social History   Socioeconomic History  . Marital status: Married    Spouse name: Claris CheMargaret  . Number of children: 1  . Years of education: college  . Highest education level: Not on file  Social Needs  . Financial resource strain: Not on file  . Food insecurity - worry: Not on file  . Food insecurity - inability: Not on file  . Transportation needs - medical: Not on file  . Transportation needs - non-medical: Not on file  Occupational History    Employer: [employer]  Tobacco Use  . Smoking status: Never Smoker  . Smokeless tobacco: Never Used  Substance and Sexual Activity  . Alcohol use: No  . Drug use: No  . Sexual activity: Not Currently  Other Topics Concern  . 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    Family History:    Family History  Problem Relation Age of Onset  . Ovarian cancer Mother   . Hyperlipidemia Father   . Hyperlipidemia Sister   . Colon cancer Neg Hx   . Colon polyps Neg Hx      ROS:  Please see the history of present illness.  ROS  All other ROS reviewed and negative.     Physical Exam/Data:   Vitals:   01/05/17 1015 01/05/17 1030 01/05/17 1045 01/05/17 1156  BP: (!) 87/62 90/62 (!) 93/59 99/60  Pulse: 84 66 (!) 105 64  Resp: (!) 23 (!) 21 (!) 23 13  Temp:    98.9 F (37.2 C)  TempSrc:     Oral  SpO2: 93% 94% 100% 100%  Weight:    155 lb 1.6 oz (70.4 kg)  Height:    5\' 8"  (1.727 m)    Intake/Output Summary (Last 24 hours) at 01/05/2017 1539 Last data filed at 01/05/2017 1300 Gross per 24 hour  Intake 240 ml  Output -  Net 240 ml   Filed Weights   01/05/17 0654 01/05/17 1156  Weight: 145 lb (65.8 kg) 155 lb 1.6 oz (70.4 kg)   Body mass index is 23.58 kg/m.  General:  Well nourished, well developed, in no acute distress HEENT: normal Lymph: no adenopathy Neck: no JVD Endocrine:  No  thryomegaly Vascular: No carotid bruits; FA pulses 2+ bilaterally without bruits  Cardiac:  irregularly irregular, regular rate, no murmurs  Lungs:  clear to auscultation bilaterally, no wheezing, rhonchi or rales  Abd: soft, nontender, no hepatomegaly  Ext: no edema Musculoskeletal:  No deformities, BUE and BLE strength normal and equal Skin: warm and dry  Neuro:  CNs 2-12 intact, no focal abnormalities noted Psych:  Normal affect   EKG:  The EKG was personally reviewed and demonstrates:  Atrial fibrillation w/ CVR Telemetry:  Telemetry was personally reviewed and demonstrates:  Atrial fibrillation w/ CVR in the 80s  Relevant CV Studies: 2D Echo 2014 Study Conclusions  Left ventricle: The cavity size was normal. Systolic function was normal. The estimated ejection fraction was in the range of 55% to 60%. Wall motion was normal; there were no regional wall motion abnormalities.  Impressions:  - No cardiac source of emboli was indentified.  Laboratory Data:  Chemistry Recent Labs  Lab 01/05/17 0800  NA 138  K 4.1  CL 104  CO2 24  GLUCOSE 106*  BUN 16  CREATININE 0.87  CALCIUM 9.0  GFRNONAA >60  GFRAA >60  ANIONGAP 10    No results for input(s): PROT, ALBUMIN, AST, ALT, ALKPHOS, BILITOT in the last 168 hours. Hematology Recent Labs  Lab 01/05/17 0800  WBC 3.6*  RBC 4.97  HGB 14.6  HCT 43.8  MCV 88.1  MCH 29.4  MCHC 33.3  RDW 13.3  PLT 105*    Cardiac Enzymes Recent Labs  Lab 01/05/17 0800  TROPONINI <0.03   No results for input(s): TROPIPOC in the last 168 hours.  BNP Recent Labs  Lab 01/05/17 0800  BNP 30.7    DDimer No results for input(s): DDIMER in the last 168 hours.  Radiology/Studies:  Dg Chest 2 View  Result Date: 01/05/2017 CLINICAL DATA:  Cough and fever.  Cardiac palpitations. EXAM: CHEST  2 VIEW COMPARISON:  May 29, 2014 FINDINGS: There is no edema or consolidation. The heart size and pulmonary vascularity are normal. No adenopathy. No bone lesions. IMPRESSION: No edema or consolidation. Electronically Signed   By: Bretta Bang III M.D.   On: 01/05/2017 07:29    Assessment and Plan:   1. Atrial Fibrillation: newly recognized. Initially with RVR in the 130s. Bolus of IV Cardizem was given resulting in significant bradycardia, down into the 30s requiring atropine x 1 and calcium gluconate. Laboratory work unremarkable. Electrolytes WNL w/ K at 4.1 and Mg at 2.0. TSH, BNP, troponin, H/H, SCr and BUN all WNL. Given his profound bradycardia after administration of Cardizem and continued borderline hypotension, we will avoid additional use rate control agents at this time. Only option at this time may be TEE guided DCCV to restore NSR. He would require at least short term OAC for this. Although there was ? regarding CVA in 2014, pt notes that his neurologist felt that CVA was unlikely after f/u neurologic testing returned negative (no evidence of infarct on repeat MRI). He has no other risk factors for CVA. No HTN, DM, known Vascular disease nor h/o CHF. He is <30 years of age. Based on this, his CHA2DS2 VASc score is 0. Will defer length of OAC to MD. For now, given plans for likely TEE/DCCV, we will initiate Eliquis 5 mg BID. He will need a total of 3 doses prior to cardioversion. Will set up for TEE/DCCV on Friday. He will also need a 2D echo to assess LVF, wall motion, valve anatomy and  chamber sizes. Possible  ischemic w/u with pharmacologic NST vs coronary CTA at some point. This can be done as an outpatient. Also given reported h/o snoring and apneic pauses, would also recommend outpatient sleep study to r/o underlying OSA.   2. Flu-like Symptoms/URI: pt did not get flu shot this year. Recommend testing for flu.   MD to follow with further recs.    For questions or updates, please contact [facility] Please consult [email or web address] for contact info under Cardiology/STEMI.   Signed, Robbie LisBrittainy Simmons, PA-C  01/05/2017 3:39 PM   Personally seen and examined. Agree with above.  56 year old male with paroxysmal atrial fibrillation perhaps started several days ago during stressful work hours and flulike symptoms with low normal blood pressure at baseline usually approximately 100-110 systolic now in the 90s, with mild thrombocytopenia, mildly reduced white blood cell count and previous rapid atrial fibrillation now under reasonable control unless he is getting up and moving.  Exam: Quiet, no acute distress, irregularly irregular rhythm, no appreciable murmurs no JVD lungs are clear no edema abdomen soft  EKG personally reviewed shows atrial fibrillation, lab work personally reviewed.  Assessment and plan: - Atrial fibrillation: Paroxysmal, plan on cardioversion with TEE on Friday.  Start Eliquis 5 mill grams twice a day.  Stop aspirin.  Did not tolerate high-dose diltiazem, caused bradycardia.  Also relative hypotension noted.  No evidence of syncope recently.  With his low white count, recent flulike illness, one may consider flu panel.  He does state that he feels like he is getting better.  Discussed the risks and benefits of anticoagulation.  He states that he bruises easily.  Described that after cardioversion he would need anticoagulation for 4 weeks total.  Since he did not have a previous stroke, he would not require long-term anticoagulation.  We will continue to follow with you.  Donato SchultzMark  Ambry Dix, MD

## 2017-01-05 NOTE — Progress Notes (Signed)
  Echocardiogram 2D Echocardiogram has been performed.  Cody Hall, Cody Hall 01/05/2017, 5:15 PM

## 2017-01-05 NOTE — ED Notes (Signed)
Md, rt, emt and 3 rn's at bedside. Pt is pale, states "I just don't feel right." fluid boluses initiated, second iv line placed, code cart at bedside, pads placed.

## 2017-01-05 NOTE — Plan of Care (Signed)
New-onset A. fib Status post loading dose dose followed by Dr. gated by resultant bradycardia.  Resolved atropine and calcium gluconate given discontinued.  [facility].  Patient heart rate now in the 90s, blood pressure stable, patient briefly asymptomatic but now improved. ED briefly spoke with cardiology who recommended observation. Admitted as observation to telemetry unit.  Laverna PeaceShayla D Nettey [facility]

## 2017-01-05 NOTE — ED Notes (Signed)
Pt states "I don't feel good, I feel dizzy, and I can't breathe." skin color pale, pt placed in reverse trendelenburg and md alerted.

## 2017-01-05 NOTE — ED Notes (Signed)
Pt is in radiology.

## 2017-01-05 NOTE — ED Triage Notes (Signed)
Pt c/o flu-like symptom since last fri. Pt reports cough, congestion and low grade fever. Pt states he feels like his heart was "out of rhythm" last night.

## 2017-01-05 NOTE — ED Notes (Signed)
Pt lying supine with eyes closed, follows commands, responds appropriately to painful stim (iv placement)

## 2017-01-06 DIAGNOSIS — I480 Paroxysmal atrial fibrillation: Secondary | ICD-10-CM | POA: Diagnosis not present

## 2017-01-06 DIAGNOSIS — R0602 Shortness of breath: Secondary | ICD-10-CM | POA: Diagnosis not present

## 2017-01-06 DIAGNOSIS — R001 Bradycardia, unspecified: Secondary | ICD-10-CM | POA: Diagnosis not present

## 2017-01-06 DIAGNOSIS — T461X5A Adverse effect of calcium-channel blockers, initial encounter: Secondary | ICD-10-CM | POA: Diagnosis not present

## 2017-01-06 DIAGNOSIS — I4891 Unspecified atrial fibrillation: Secondary | ICD-10-CM | POA: Diagnosis not present

## 2017-01-06 LAB — CBC
HEMATOCRIT: 42.2 % (ref 39.0–52.0)
Hemoglobin: 13.8 g/dL (ref 13.0–17.0)
MCH: 29.4 pg (ref 26.0–34.0)
MCHC: 32.7 g/dL (ref 30.0–36.0)
MCV: 89.8 fL (ref 78.0–100.0)
Platelets: 103 10*3/uL — ABNORMAL LOW (ref 150–400)
RBC: 4.7 MIL/uL (ref 4.22–5.81)
RDW: 13.7 % (ref 11.5–15.5)
WBC: 3.1 10*3/uL — AB (ref 4.0–10.5)

## 2017-01-06 LAB — BASIC METABOLIC PANEL
Anion gap: 7 (ref 5–15)
BUN: 12 mg/dL (ref 6–20)
CHLORIDE: 109 mmol/L (ref 101–111)
CO2: 24 mmol/L (ref 22–32)
Calcium: 8.5 mg/dL — ABNORMAL LOW (ref 8.9–10.3)
Creatinine, Ser: 0.88 mg/dL (ref 0.61–1.24)
GFR calc non Af Amer: >60 mL/min (ref >60)
Glucose, Bld: 99 mg/dL (ref 65–99)
POTASSIUM: 4.1 mmol/L (ref 3.5–5.1)
SODIUM: 140 mmol/L (ref 135–145)

## 2017-01-06 MED ORDER — DILTIAZEM HCL 30 MG PO TABS: 30.0000 mg | ORAL_TABLET | Freq: Four times a day (QID) | ORAL | 0 refills | Status: DC | PRN

## 2017-01-06 NOTE — Progress Notes (Signed)
Pt converted to NSR confirmed by EKG

## 2017-01-06 NOTE — Discharge Instructions (Signed)
Atrial Fibrillation Atrial fibrillation is a type of irregular or rapid heartbeat (arrhythmia). In atrial fibrillation, the heart quivers continuously in a chaotic pattern. This occurs when parts of the heart receive disorganized signals that make the heart unable to pump blood normally. This can increase the risk for stroke, heart failure, and other heart-related conditions. There are different types of atrial fibrillation, including:  Paroxysmal atrial fibrillation. This type starts suddenly, and it usually stops on its own shortly after it starts.  Persistent atrial fibrillation. This type often lasts longer than a week. It may stop on its own or with treatment.  Long-lasting persistent atrial fibrillation. This type lasts longer than 12 months.  Permanent atrial fibrillation. This type does not go away.  Talk with your health care provider to learn about the type of atrial fibrillation that you have. What are the causes? This condition is caused by some heart-related conditions or procedures, including:  A heart attack.  Coronary artery disease.  Heart failure.  Heart valve conditions.  High blood pressure.  Inflammation of the sac that surrounds the heart (pericarditis).  Heart surgery.  Certain heart rhythm disorders, such as Wolf-Parkinson-White syndrome.  Other causes include:  Pneumonia.  Obstructive sleep apnea.  Blockage of an artery in the lungs (pulmonary embolism, or PE).  Lung cancer.  Chronic lung disease.  Thyroid problems, especially if the thyroid is overactive (hyperthyroidism).  Caffeine.  Excessive alcohol use or illegal drug use.  Use of some medicines, including certain decongestants and diet pills.  Sometimes, the cause cannot be found. What increases the risk? This condition is more likely to develop in:  People who are older in age.  People who smoke.  People who have diabetes mellitus.  People who are overweight  (obese).  Athletes who exercise vigorously.  What are the signs or symptoms? Symptoms of this condition include:  A feeling that your heart is beating rapidly or irregularly.  A feeling of discomfort or pain in your chest.  Shortness of breath.  Sudden light-headedness or weakness.  Getting tired easily during exercise.  In some cases, there are no symptoms. How is this diagnosed? Your health care provider may be able to detect atrial fibrillation when taking your pulse. If detected, this condition may be diagnosed with:  An electrocardiogram (ECG).  A Holter monitor test that records your heartbeat patterns over a 24-hour period.  Transthoracic echocardiogram (TTE) to evaluate how blood flows through your heart.  Transesophageal echocardiogram (TEE) to view more detailed images of your heart.  A stress test.  Imaging tests, such as a CT scan or chest X-ray.  Blood tests.  How is this treated? The main goals of treatment are to prevent blood clots from forming and to keep your heart beating at a normal rate and rhythm. The type of treatment that you receive depends on many factors, such as your underlying medical conditions and how you feel when you are experiencing atrial fibrillation. This condition may be treated with:  Medicine to slow down the heart rate, bring the heart's rhythm back to normal, or prevent clots from forming.  Electrical cardioversion. This is a procedure that resets your heart's rhythm by delivering a controlled, low-energy shock to the heart through your skin.  Different types of ablation, such as catheter ablation, catheter ablation with pacemaker, or surgical ablation. These procedures destroy the heart tissues that send abnormal signals. When the pacemaker is used, it is placed under your skin to help your heart beat in   a regular rhythm.  Follow these instructions at home:  Take over-the counter and prescription medicines only as told by your  health care provider.  If your health care provider prescribed a blood-thinning medicine (anticoagulant), take it exactly as told. Taking too much blood-thinning medicine can cause bleeding. If you do not take enough blood-thinning medicine, you will not have the protection that you need against stroke and other problems.  Do not use tobacco products, including cigarettes, chewing tobacco, and e-cigarettes. If you need help quitting, ask your health care provider.  If you have obstructive sleep apnea, manage your condition as told by your health care provider.  Do not drink alcohol.  Do not drink beverages that contain caffeine, such as coffee, soda, and tea.  Maintain a healthy weight. Do not use diet pills unless your health care provider approves. Diet pills may make heart problems worse.  Follow diet instructions as told by your health care provider.  Exercise regularly as told by your health care provider.  Keep all follow-up visits as told by your health care provider. This is important. How is this prevented?  Avoid drinking beverages that contain caffeine or alcohol.  Avoid certain medicines, especially medicines that are used for breathing problems.  Avoid certain herbs and herbal medicines, such as those that contain ephedra or ginseng.  Do not use illegal drugs, such as cocaine and amphetamines.  Do not smoke.  Manage your high blood pressure. Contact a health care provider if:  You notice a change in the rate, rhythm, or strength of your heartbeat.  You are taking an anticoagulant and you notice increased bruising.  You tire more easily when you exercise or exert yourself. Get help right away if:  You have chest pain, abdominal pain, sweating, or weakness.  You feel nauseous.  You notice blood in your vomit, bowel movement, or urine.  You have shortness of breath.  You suddenly have swollen feet and ankles.  You feel dizzy.  You have sudden weakness or  numbness of the face, arm, or leg, especially on one side of the body.  You have trouble speaking, trouble understanding, or both (aphasia).  Your face or your eyelid droops on one side. These symptoms may represent a serious problem that is an emergency. Do not wait to see if the symptoms will go away. Get medical help right away. Call your local emergency services (911 in the [place] Do not drive yourself to the hospital. This information is not intended to replace advice given to you by your health care provider. Make sure you discuss any questions you have with your health care provider. Document Released: 12/28/2004 Document Revised: 05/07/2015 Document Reviewed: 04/24/2014 Elsevier Interactive Patient Education  2018 [employer]  

## 2017-01-06 NOTE — Progress Notes (Signed)
Reviewed discharge paperwork with patient and reviewed new Cardizem medication. Removed IV, no complications. Pt had no further questions at the time. Discharge to home with wife

## 2017-01-06 NOTE — Discharge Summary (Signed)
[facility]  Physician Discharge Summary   Patient ID: Cody Hall MRN: [id number] DOB/AGE: 1960-04-03 56 y.o.  Admit date: 01/05/2017 Discharge date: 01/06/2017  PCP: Patient, No Pcp Per  DISCHARGE DIAGNOSES:  Active Problems:   Atrial fibrillation, new onset (HCC)   RECOMMENDATIONS FOR OUTPATIENT FOLLOW UP: 1. Outpatient follow-up with cardiology as needed.   DISCHARGE CONDITION: fair  Diet recommendation: Heart healthy  Filed Weights   01/05/17 0654 01/05/17 1156 01/06/17 0414  Weight: 65.8 kg (145 lb) 70.4 kg (155 lb 1.6 oz) 70.3 kg (155 lb)    INITIAL HISTORY: 56 year old Caucasian male with a past medical history of factor V [place] who was admitted to the hospital for further management of atrial fibrillation with RVR.  Symptoms started the week prior to admission with generalized malaise and chills.  He also admitted to a long hours last week with very less sleep.  Consultations:  Cardiology  Procedures: Transthoracic echocardiogram Study Conclusions  - Left ventricle: The cavity size was normal. Wall thickness was   normal. Systolic function was normal. The estimated ejection   fraction was in the range of 55% to 60%. Wall motion was normal;   there were no regional wall motion abnormalities.  Impressions:  - Normal LV systolic function; trace MR and TR.   HOSPITAL COURSE:   Patient was admitted for further management of new onset atrial fibrillation.  Patient was seen by cardiology.  He was initially given a bolus dose of Cardizem with which he became significantly bradycardic.  Patient was monitored in the hospital off of medications.  He was started on Eliquis with plan to do cardioversion on Friday.  However overnight patient converted to sinus rhythm.  Discussed with cardiology this morning.  They evaluated the patient.  They recommend discharging him on low-dose diltiazem to be taken as needed for elevated heart rate.  Patient does  not need anticoagulation due to low stroke risk based on the chads 2 vascular score.  Rest of his medical issues are stable.  Okay for discharge home.    PERTINENT LABS:  The results of significant diagnostics from this hospitalization (including imaging, microbiology, ancillary and laboratory) are listed below for reference.     Labs: Basic Metabolic Panel: Recent Labs  Lab 01/05/17 0800 01/06/17 0526  NA 138 140  K 4.1 4.1  CL 104 109  CO2 24 24  GLUCOSE 106* 99  BUN 16 12  CREATININE 0.87 0.88  CALCIUM 9.0 8.5*  MG 2.0  --    CBC: Recent Labs  Lab 01/05/17 0800 01/06/17 0526  WBC 3.6* 3.1*  NEUTROABS 2.5  --   HGB 14.6 13.8  HCT 43.8 42.2  MCV 88.1 89.8  PLT 105* 103*   Cardiac Enzymes: Recent Labs  Lab 01/05/17 0800  TROPONINI <0.03   BNP: BNP (last 3 results) Recent Labs    01/05/17 0800  BNP 30.7      IMAGING STUDIES Dg Chest 2 View  Result Date: 01/05/2017 CLINICAL DATA:  Cough and fever.  Cardiac palpitations. EXAM: CHEST  2 VIEW COMPARISON:  May 29, 2014 FINDINGS: There is no edema or consolidation. The heart size and pulmonary vascularity are normal. No adenopathy. No bone lesions. IMPRESSION: No edema or consolidation. Electronically Signed   By: Bretta BangWilliam  Woodruff III M.D.   On: 01/05/2017 07:29    DISCHARGE EXAMINATION: Vitals:   01/05/17 1045 01/05/17 1156 01/05/17 1938 01/06/17 0414  BP: (!) 93/59 99/60 91/67  108/72  Pulse: (!) 105 64 91  62  Resp: (!) 23 13 (!) 27 20  Temp:  98.9 F (37.2 C) 98.2 F (36.8 C) 98.2 F (36.8 C)  TempSrc:  Oral Oral Other (Comment)  SpO2: 100% 100% 100% 100%  Weight:  70.4 kg (155 lb 1.6 oz)  70.3 kg (155 lb)  Height:  5\' 8"  (1.727 m)     General appearance: alert, cooperative, appears stated age and no distress Resp: clear to auscultation bilaterally Cardio: regular rate and rhythm, S1, S2 normal, no murmur, click, rub or gallop GI: soft, non-tender; bowel sounds normal; no masses,  no  organomegaly  DISPOSITION: Home  Discharge Instructions    Call MD for:  difficulty breathing, headache or visual disturbances   Complete by:  As directed    Call MD for:  extreme fatigue   Complete by:  As directed    Call MD for:  persistant dizziness or light-headedness   Complete by:  As directed    Call MD for:  persistant nausea and vomiting   Complete by:  As directed    Call MD for:  severe uncontrolled pain   Complete by:  As directed    Call MD for:  temperature >100.4   Complete by:  As directed    Diet - low sodium heart healthy   Complete by:  As directed    Discharge instructions   Complete by:  As directed    Seek attention if heart rate not controlled by Diltiazem. Seek attention immediately if you develop severe symptoms such as shortness of breath, chest pain, passing out spells.  You were cared for by a hospitalist during your hospital stay. If you have any questions about your discharge medications or the care you received while you were in the hospital after you are discharged, you can call the unit and asked to speak with the hospitalist on call if the hospitalist that took care of you is not available. Once you are discharged, your primary care physician will handle any further medical issues. Please note that NO REFILLS for any discharge medications will be authorized once you are discharged, as it is imperative that you return to your primary care physician (or establish a relationship with a primary care physician if you do not have one) for your aftercare needs so that they can reassess your need for medications and monitor your lab values. If you do not have a primary care physician, you can call [phone] for a physician referral.   Increase activity slowly   Complete by:  As directed         Allergies as of 01/06/2017      Reactions   Flexeril [cyclobenzaprine Hcl] Other (See Comments)   Unknown    Lipitor [atorvastatin]    Rash, muscle aches and chest  pressure   Meloxicam    AR   nasal congestion   Penicillins    childhood      Medication List    TAKE these medications   aspirin 325 MG tablet Take 1 tablet (325 mg total) by mouth daily.   diltiazem 30 MG tablet Commonly known as:  CARDIZEM Take 1 tablet (30 mg total) by mouth 4 (four) times daily as needed (take if your heart rate is greater than 120).        Follow-up Information    Jake BatheSkains, Mark C, MD Follow up.   Specialty:  Cardiology Why:  follow up as needed for palpitations, high heart rate Contact information: [address]  [address] [place] [address] [phone]           TOTAL DISCHARGE TIME: 35 mins  Osvaldo Shipper  [facility] Pager [phone]  01/06/2017, 2:34 PM

## 2017-01-06 NOTE — Progress Notes (Signed)
Progress Note  Patient Name: Cody Hall Date of Encounter: 01/06/2017  Primary Cardiologist: No primary care provider on file. Skains new  Subjective   Converted, feeling better, no SOB  Inpatient Medications    Scheduled Meds: . apixaban  5 mg Oral BID   Continuous Infusions:  PRN Meds: acetaminophen **OR** acetaminophen, bisacodyl, polyethylene glycol   Vital Signs    Vitals:   01/05/17 1045 01/05/17 1156 01/05/17 1938 01/06/17 0414  BP: (!) 93/59 99/60 91/67  108/72  Pulse: (!) 105 64 91 62  Resp: (!) 23 13 (!) 27 20  Temp:  98.9 F (37.2 C) 98.2 F (36.8 C) 98.2 F (36.8 C)  TempSrc:  Oral Oral Other (Comment)  SpO2: 100% 100% 100% 100%  Weight:  155 lb 1.6 oz (70.4 kg)  155 lb (70.3 kg)  Height:  5\' 8"  (1.727 m)      Intake/Output Summary (Last 24 hours) at 01/06/2017 0905 Last data filed at 01/06/2017 0847 Gross per 24 hour  Intake 840 ml  Output -  Net 840 ml   Filed Weights   01/05/17 0654 01/05/17 1156 01/06/17 0414  Weight: 145 lb (65.8 kg) 155 lb 1.6 oz (70.4 kg) 155 lb (70.3 kg)    Telemetry    NSR - Personally Reviewed  ECG    NSR from AFIB - Personally Reviewed  Physical Exam   GEN: No acute distress.   Neck: No JVD Cardiac: RRR, no murmurs, rubs, or gallops.  Respiratory: Clear to auscultation bilaterally. GI: Soft, nontender, non-distended  MS: No edema; No deformity. Neuro:  Nonfocal  Psych: Normal affect   Labs    Chemistry Recent Labs  Lab 01/05/17 0800 01/06/17 0526  NA 138 140  K 4.1 4.1  CL 104 109  CO2 24 24  GLUCOSE 106* 99  BUN 16 12  CREATININE 0.87 0.88  CALCIUM 9.0 8.5*  GFRNONAA >60 >60  GFRAA >60 >60  ANIONGAP 10 7     Hematology Recent Labs  Lab 01/05/17 0800 01/06/17 0526  WBC 3.6* 3.1*  RBC 4.97 4.70  HGB 14.6 13.8  HCT 43.8 42.2  MCV 88.1 89.8  MCH 29.4 29.4  MCHC 33.3 32.7  RDW 13.3 13.7  PLT 105* 103*    Cardiac Enzymes Recent Labs  Lab 01/05/17 0800  TROPONINI  <0.03   No results for input(s): TROPIPOC in the last 168 hours.   BNP Recent Labs  Lab 01/05/17 0800  BNP 30.7     DDimer No results for input(s): DDIMER in the last 168 hours.   Radiology    Dg Chest 2 View  Result Date: 01/05/2017 CLINICAL DATA:  Cough and fever.  Cardiac palpitations. EXAM: CHEST  2 VIEW COMPARISON:  May 29, 2014 FINDINGS: There is no edema or consolidation. The heart size and pulmonary vascularity are normal. No adenopathy. No bone lesions. IMPRESSION: No edema or consolidation. Electronically Signed   By: Bretta BangWilliam  Woodruff III M.D.   On: 01/05/2017 07:29    Cardiac Studies   ECHO - Left ventricle: The cavity size was normal. Wall thickness was   normal. Systolic function was normal. The estimated ejection   fraction was in the range of 55% to 60%. Wall motion was normal;   there were no regional wall motion abnormalities.  Impressions:  - Normal LV systolic function; trace MR and TR.  Patient Profile     56 y.o. male with PAF in the setting of stress and possible viral illness.  Assessment & Plan    PAF  - auto converted.   - Given low CHADS VASC - 0, no need for long term anticoagulation.   - Interestingly, ASA does not reduce stroke risk in AFIB. No real recommendation for this either.  - Would give him cardizem 30mg  PO Q6 hrs PRN if another episode develops.  - ECHO reassuring  - PAF likely a result of viral illness  OK for DC.   For questions or updates, please contact [facility] Please consult [email or web address] for contact info under Cardiology/STEMI.      Signed, Donato SchultzMark Skains, MD  01/06/2017, 9:05 AM

## 2017-01-07 SURGERY — CARDIOVERSION
Anesthesia: General

## 2017-01-25 DIAGNOSIS — M9901 Segmental and somatic dysfunction of cervical region: Secondary | ICD-10-CM | POA: Diagnosis not present

## 2017-01-25 DIAGNOSIS — M9902 Segmental and somatic dysfunction of thoracic region: Secondary | ICD-10-CM | POA: Diagnosis not present

## 2017-01-25 DIAGNOSIS — M542 Cervicalgia: Secondary | ICD-10-CM | POA: Diagnosis not present

## 2017-03-16 DIAGNOSIS — M9901 Segmental and somatic dysfunction of cervical region: Secondary | ICD-10-CM | POA: Diagnosis not present

## 2017-03-16 DIAGNOSIS — M542 Cervicalgia: Secondary | ICD-10-CM | POA: Diagnosis not present

## 2017-03-16 DIAGNOSIS — M9902 Segmental and somatic dysfunction of thoracic region: Secondary | ICD-10-CM | POA: Diagnosis not present

## 2017-04-11 DIAGNOSIS — M9901 Segmental and somatic dysfunction of cervical region: Secondary | ICD-10-CM | POA: Diagnosis not present

## 2017-04-11 DIAGNOSIS — M9902 Segmental and somatic dysfunction of thoracic region: Secondary | ICD-10-CM | POA: Diagnosis not present

## 2017-04-11 DIAGNOSIS — M542 Cervicalgia: Secondary | ICD-10-CM | POA: Diagnosis not present

## 2017-05-05 DIAGNOSIS — M9901 Segmental and somatic dysfunction of cervical region: Secondary | ICD-10-CM | POA: Diagnosis not present

## 2017-05-05 DIAGNOSIS — M9902 Segmental and somatic dysfunction of thoracic region: Secondary | ICD-10-CM | POA: Diagnosis not present

## 2017-05-05 DIAGNOSIS — M542 Cervicalgia: Secondary | ICD-10-CM | POA: Diagnosis not present

## 2017-05-31 DIAGNOSIS — M542 Cervicalgia: Secondary | ICD-10-CM | POA: Diagnosis not present

## 2017-05-31 DIAGNOSIS — M9902 Segmental and somatic dysfunction of thoracic region: Secondary | ICD-10-CM | POA: Diagnosis not present

## 2017-05-31 DIAGNOSIS — M9901 Segmental and somatic dysfunction of cervical region: Secondary | ICD-10-CM | POA: Diagnosis not present

## 2017-07-04 DIAGNOSIS — M9901 Segmental and somatic dysfunction of cervical region: Secondary | ICD-10-CM | POA: Diagnosis not present

## 2017-07-04 DIAGNOSIS — M542 Cervicalgia: Secondary | ICD-10-CM | POA: Diagnosis not present

## 2017-07-04 DIAGNOSIS — M9902 Segmental and somatic dysfunction of thoracic region: Secondary | ICD-10-CM | POA: Diagnosis not present

## 2017-07-28 DIAGNOSIS — M542 Cervicalgia: Secondary | ICD-10-CM | POA: Diagnosis not present

## 2017-07-28 DIAGNOSIS — M9901 Segmental and somatic dysfunction of cervical region: Secondary | ICD-10-CM | POA: Diagnosis not present

## 2017-07-28 DIAGNOSIS — M9902 Segmental and somatic dysfunction of thoracic region: Secondary | ICD-10-CM | POA: Diagnosis not present

## 2017-08-23 DIAGNOSIS — M9902 Segmental and somatic dysfunction of thoracic region: Secondary | ICD-10-CM | POA: Diagnosis not present

## 2017-08-23 DIAGNOSIS — M9901 Segmental and somatic dysfunction of cervical region: Secondary | ICD-10-CM | POA: Diagnosis not present

## 2017-08-23 DIAGNOSIS — M542 Cervicalgia: Secondary | ICD-10-CM | POA: Diagnosis not present

## 2017-10-04 DIAGNOSIS — M9902 Segmental and somatic dysfunction of thoracic region: Secondary | ICD-10-CM | POA: Diagnosis not present

## 2017-10-04 DIAGNOSIS — M542 Cervicalgia: Secondary | ICD-10-CM | POA: Diagnosis not present

## 2017-10-04 DIAGNOSIS — M9901 Segmental and somatic dysfunction of cervical region: Secondary | ICD-10-CM | POA: Diagnosis not present

## 2017-11-07 DIAGNOSIS — M542 Cervicalgia: Secondary | ICD-10-CM | POA: Diagnosis not present

## 2017-11-07 DIAGNOSIS — M9902 Segmental and somatic dysfunction of thoracic region: Secondary | ICD-10-CM | POA: Diagnosis not present

## 2017-11-07 DIAGNOSIS — M9901 Segmental and somatic dysfunction of cervical region: Secondary | ICD-10-CM | POA: Diagnosis not present

## 2017-12-01 DIAGNOSIS — M9902 Segmental and somatic dysfunction of thoracic region: Secondary | ICD-10-CM | POA: Diagnosis not present

## 2017-12-01 DIAGNOSIS — M542 Cervicalgia: Secondary | ICD-10-CM | POA: Diagnosis not present

## 2017-12-01 DIAGNOSIS — M9901 Segmental and somatic dysfunction of cervical region: Secondary | ICD-10-CM | POA: Diagnosis not present

## 2017-12-27 ENCOUNTER — Ambulatory Visit: Payer: Federal, State, Local not specified - PPO | Admitting: Family Medicine

## 2017-12-27 ENCOUNTER — Encounter: Payer: Self-pay | Admitting: Family Medicine

## 2017-12-27 VITALS — BP 110/70 | HR 68 | Ht 68.0 in | Wt 153.2 lb

## 2017-12-27 DIAGNOSIS — M9901 Segmental and somatic dysfunction of cervical region: Secondary | ICD-10-CM | POA: Diagnosis not present

## 2017-12-27 DIAGNOSIS — M9902 Segmental and somatic dysfunction of thoracic region: Secondary | ICD-10-CM | POA: Diagnosis not present

## 2017-12-27 DIAGNOSIS — H6123 Impacted cerumen, bilateral: Secondary | ICD-10-CM | POA: Diagnosis not present

## 2017-12-27 DIAGNOSIS — Z0000 Encounter for general adult medical examination without abnormal findings: Secondary | ICD-10-CM

## 2017-12-27 DIAGNOSIS — M542 Cervicalgia: Secondary | ICD-10-CM | POA: Diagnosis not present

## 2017-12-27 NOTE — Progress Notes (Signed)
Established Patient Office Visit  Subjective:  Patient ID: Cody Hall, male    DOB: 11-Jan-1961  Age: 57 y.o. MRN: [id number]  CC:  Chief Complaint  Patient presents with  . Establish Care    HPI Cody Hall presents for a complete physical exam.  He is nonfasting today.  Significant past medical history of what was described as trans-global amnesia.  Acute CVA was ruled out.  Patient recovered without residual defect.  This happened back in 2013.  Several years ago he experienced a short run of atrial fibrillation after being stressed with a URI, bad weather and his job at the [employer].  He was admitted to the hospital and reverted to normal sinus rhythm.  He has not experienced atrial fibrillation since that time.  At that time he was diagnosed with factor V Leiden mutation.  He has never experienced a DVT or PE.  Past medical history of ceruminosis that he uses over-the-counter earwax removal kits for.  He deals with constipation on occasion he says is relieved with yogurt and smoothie ingestion.  He is married and has a grown son who lives in the [place] area.  He does not smoke use illicit drugs.  He does not drink alcohol he is active on his job and does not get regular exercise outside of that arena.  Past Medical History:  Diagnosis Date  . Bronchospasm   . Chronic constipation   . CVA (cerebral vascular accident) (HCC) 2014   "vs transglobal amnesia; never had either one according to Dr. Pearlean Brownie" (01/05/2017)  . Elevated cholesterol    LDL  . GERD (gastroesophageal reflux disease)   . Low testosterone   . New onset atrial fibrillation (HCC) 01/05/2017  . Other malaise and fatigue   . Seasonal allergies   . Tic    FACIAL    Past Surgical History:  Procedure Laterality Date  . APPENDECTOMY      Family History  Problem Relation Age of Onset  . Ovarian cancer Mother   . Hyperlipidemia Father   . Hyperlipidemia Sister   . Colon cancer Neg Hx   . Colon  polyps Neg Hx     Social History   Socioeconomic History  . Marital status: Married    Spouse name: Claris Che  . Number of children: 1  . Years of education: college  . Highest education level: Not on file  Occupational History    Employer: [employer]  Social Needs  . Financial resource strain: Not on file  . Food insecurity:    Worry: Not on file    Inability: Not on file  . Transportation needs:    Medical: Not on file    Non-medical: Not on file  Tobacco Use  . Smoking status: Never Smoker  . Smokeless tobacco: Never Used  Substance and Sexual Activity  . Alcohol use: No  . Drug use: No  . Sexual activity: Not Currently  Lifestyle  . Physical activity:    Days per week: Not on file    Minutes per session: Not on file  . Stress: Not on file  Relationships  . Social connections:    Talks on phone: Not on file    Gets together: Not on file    Attends religious service: Not on file    Active member of club or organization: Not on file    Attends meetings of clubs or organizations: Not on file    Relationship status: Not on file  . Intimate  partner violence:    Fear of current or ex partner: Not on file    Emotionally abused: Not on file    Physically abused: Not on file    Forced sexual activity: Not on file  Other Topics Concern  . 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    Outpatient Medications Prior to Visit  Medication Sig Dispense Refill  . aspirin 325 MG tablet Take 1 tablet (325 mg total) by mouth daily.    Marland Kitchen. diltiazem (CARDIZEM) 30 MG tablet Take 1 tablet (30 mg total) by mouth 4 (four) times daily as needed (take if your heart rate is greater than 120). 30 tablet 0   No facility-administered medications prior to visit.     Allergies  Allergen Reactions  . Flexeril [Cyclobenzaprine Hcl] Other (See Comments)    Unknown   . Lipitor [Atorvastatin]     Rash, muscle aches and chest  pressure  . Meloxicam     AR   nasal congestion  . Penicillins     childhood    ROS Review of Systems  Constitutional: Negative for appetite change, chills, diaphoresis, fatigue, fever and unexpected weight change.  HENT: Negative for congestion, dental problem, ear discharge, postnasal drip, rhinorrhea and sinus pain.   Eyes: Negative for photophobia and visual disturbance.  Respiratory: Negative.   Cardiovascular: Negative.   Gastrointestinal: Negative.   Endocrine: Negative for polyphagia and polyuria.  Genitourinary: Negative.  Negative for difficulty urinating, frequency and urgency.  Musculoskeletal: Negative for gait problem and joint swelling.  Skin: Negative for pallor.  Allergic/Immunologic: Negative for immunocompromised state.  Neurological: Negative for light-headedness, numbness and headaches.  Hematological: Does not bruise/bleed easily.  Psychiatric/Behavioral: Negative.       Objective:    Physical Exam  Constitutional: He is oriented to person, place, and time. He appears well-developed and well-nourished. No distress.  HENT:  Head: Normocephalic and atraumatic.  Right Ear: External ear normal.  Left Ear: External ear normal.  Mouth/Throat: Oropharynx is clear and moist. No oropharyngeal exudate.  Eyes: Pupils are equal, round, and reactive to light. Conjunctivae are normal. Right eye exhibits no discharge. Left eye exhibits no discharge. No scleral icterus.  Neck: Neck supple. No JVD present. No tracheal deviation present. No thyromegaly present.  Cardiovascular: Normal rate, regular rhythm and normal heart sounds.  Pulmonary/Chest: Effort normal and breath sounds normal. No stridor.  Abdominal: Soft. Bowel sounds are normal.  Genitourinary: Rectum:     Guaiac result negative.     External hemorrhoid present.     No rectal mass, anal fissure, tenderness, internal hemorrhoid or abnormal anal tone.  Prostate is enlarged. Prostate is not tender.    Musculoskeletal:        General: No edema.  Lymphadenopathy:    He has no cervical adenopathy.  Neurological: He is alert and oriented to person, place, and time.  Skin: Skin is warm and dry. He is not diaphoretic.  Psychiatric: He has a normal mood and affect. His behavior is normal.    BP 110/70   Pulse 68   Ht 5\' 8"  (1.727 m)   Wt 153 lb 4 oz (69.5 kg)   SpO2 98%   BMI 23.30 kg/m  Wt Readings from Last 3 Encounters:  12/27/17 153 lb 4 oz (69.5 kg)  01/06/17 155 lb (70.3 kg)  05/29/14 150 lb (68 kg)   BP Readings from Last  3 Encounters:  12/27/17 110/70  01/06/17 108/72  05/29/14 120/84   Guideline developer:  UpToDate (see UpToDate for funding source) Date Released: June 2014  Health Maintenance Due  Topic Date Due  . Hepatitis C Screening  13-Jan-1960  . HIV Screening  05/03/1975  . TETANUS/TDAP  06/12/2010  . INFLUENZA VACCINE  08/11/2017    There are no preventive care reminders to display for this patient.  Lab Results  Component Value Date   TSH 1.271 01/05/2017   Lab Results  Component Value Date   WBC 3.1 (L) 01/06/2017   HGB 13.8 01/06/2017   HCT 42.2 01/06/2017   MCV 89.8 01/06/2017   PLT 103 (L) 01/06/2017   Lab Results  Component Value Date   NA 140 01/06/2017   K 4.1 01/06/2017   CO2 24 01/06/2017   GLUCOSE 99 01/06/2017   BUN 12 01/06/2017   CREATININE 0.88 01/06/2017   BILITOT 0.6 08/21/2013   ALKPHOS 38 (L) 08/21/2013   AST 19 08/21/2013   ALT 18 08/21/2013   PROT 6.6 08/21/2013   ALBUMIN 4.7 08/21/2013   CALCIUM 8.5 (L) 01/06/2017   ANIONGAP 7 01/06/2017   Lab Results  Component Value Date   CHOL 180 08/21/2013   Lab Results  Component Value Date   HDL 55 08/21/2013   Lab Results  Component Value Date   LDLCALC 114 (H) 08/21/2013   Lab Results  Component Value Date   TRIG 55 08/21/2013   Lab Results  Component Value Date   CHOLHDL 3.3 08/21/2013   Lab Results  Component Value Date   HGBA1C 5.2 03/18/2012       Assessment & Plan:   Problem List Items Addressed This Visit      Nervous and Auditory   Excessive cerumen in both ear canals     Other   Health care maintenance - Primary   Relevant Orders   CBC   Comprehensive metabolic panel   LDL cholesterol, direct   Lipid panel   PSA   TSH   Urinalysis, Routine w reflex microscopic      No orders of the defined types were placed in this encounter.   Follow-up: Return in about 1 year (around 12/28/2018), or if symptoms worsen or fail to improve.   Patient was given information on disease prevention and health maintenance.  He was also given information on eustachian tube dysfunction because he had developed acute ear congestion after pesticide exposure.  He will return fasting for above ordered blood work.

## 2017-12-27 NOTE — Patient Instructions (Signed)
Health Maintenance, Male A healthy lifestyle and preventive care is important for your health and wellness. Ask your health care provider about what schedule of regular examinations is right for you. What should I know about weight and diet? Eat a Healthy Diet  Eat plenty of vegetables, fruits, whole grains, low-fat dairy products, and lean protein.  Do not eat a lot of foods high in solid fats, added sugars, or salt.  Maintain a Healthy Weight Regular exercise can help you achieve or maintain a healthy weight. You should:  Do at least 150 minutes of exercise each week. The exercise should increase your heart rate and make you sweat (moderate-intensity exercise).  Do strength-training exercises at least twice a week.  Watch Your Levels of Cholesterol and Blood Lipids  Have your blood tested for lipids and cholesterol every 5 years starting at 57 years of age. If you are at high risk for heart disease, you should start having your blood tested when you are 57 years old. You may need to have your cholesterol levels checked more often if: ? Your lipid or cholesterol levels are high. ? You are older than 57 years of age. ? You are at high risk for heart disease.  What should I know about cancer screening? Many types of cancers can be detected early and may often be prevented. Lung Cancer  You should be screened every year for lung cancer if: ? You are a current smoker who has smoked for at least 30 years. ? You are a former smoker who has quit within the past 15 years.  Talk to your health care provider about your screening options, when you should start screening, and how often you should be screened.  Colorectal Cancer  Routine colorectal cancer screening usually begins at 57 years of age and should be repeated every 5-10 years until you are 57 years old. You may need to be screened more often if early forms of precancerous polyps or small growths are found. Your health care provider  may recommend screening at an earlier age if you have risk factors for colon cancer.  Your health care provider may recommend using home test kits to check for hidden blood in the stool.  A small camera at the end of a tube can be used to examine your colon (sigmoidoscopy or colonoscopy). This checks for the earliest forms of colorectal cancer.  Prostate and Testicular Cancer  Depending on your age and overall health, your health care provider may do certain tests to screen for prostate and testicular cancer.  Talk to your health care provider about any symptoms or concerns you have about testicular or prostate cancer.  Skin Cancer  Check your skin from head to toe regularly.  Tell your health care provider about any new moles or changes in moles, especially if: ? There is a change in a mole's size, shape, or color. ? You have a mole that is larger than a pencil eraser.  Always use sunscreen. Apply sunscreen liberally and repeat throughout the day.  Protect yourself by wearing long sleeves, pants, a wide-brimmed hat, and sunglasses when outside.  What should I know about heart disease, diabetes, and high blood pressure?  If you are 18-39 years of age, have your blood pressure checked every 3-5 years. If you are 40 years of age or older, have your blood pressure checked every year. You should have your blood pressure measured twice-once when you are at a hospital or clinic, and once when   you are not at a hospital or clinic. Record the average of the two measurements. To check your blood pressure when you are not at a hospital or clinic, you can use: ? An automated blood pressure machine at a pharmacy. ? A home blood pressure monitor.  Talk to your health care provider about your target blood pressure.  If you are between 3345-266 years old, ask your health care provider if you should take aspirin to prevent heart disease.  Have regular diabetes screenings by checking your fasting blood  sugar level. ? If you are at a normal weight and have a low risk for diabetes, have this test once every three years after the age of 57. ? If you are overweight and have a high risk for diabetes, consider being tested at a younger age or more often.  A one-time screening for abdominal aortic aneurysm (AAA) by ultrasound is recommended for men aged 65-75 years who are current or former smokers. What should I know about preventing infection? Hepatitis B If you have a higher risk for hepatitis B, you should be screened for this virus. Talk with your health care provider to find out if you are at risk for hepatitis B infection. Hepatitis C Blood testing is recommended for:  Everyone born from [id number] through 1965.  Anyone with known risk factors for hepatitis C.  Sexually Transmitted Diseases (STDs)  You should be screened each year for STDs including gonorrhea and chlamydia if: ? You are sexually active and are younger than 57 years of age. ? You are older than 57 years of age and your health care provider tells you that you are at risk for this type of infection. ? Your sexual activity has changed since you were last screened and you are at an increased risk for chlamydia or gonorrhea. Ask your health care provider if you are at risk.  Talk with your health care provider about whether you are at high risk of being infected with HIV. Your health care provider may recommend a prescription medicine to help prevent HIV infection.  What else can I do?  Schedule regular health, dental, and eye exams.  Stay current with your vaccines (immunizations).  Do not use any tobacco products, such as cigarettes, chewing tobacco, and e-cigarettes. If you need help quitting, ask your health care provider.  Limit alcohol intake to no more than 2 drinks per day. One drink equals 12 ounces of beer, 5 ounces of wine, or 1 ounces of hard liquor.  Do not use street drugs.  Do not share needles.  Ask your  health care provider for help if you need support or information about quitting drugs.  Tell your health care provider if you often feel depressed.  Tell your health care provider if you have ever been abused or do not feel safe at home. This information is not intended to replace advice given to you by your health care provider. Make sure you discuss any questions you have with your health care provider. Document Released: 06/26/2007 Document Revised: 08/27/2015 Document Reviewed: 10/01/2014 Elsevier Interactive Patient Education  2018 [employer]  Eustachian Tube Dysfunction The eustachian tube connects the middle ear to the back of the nose. It regulates air pressure in the middle ear by allowing air to move between the ear and nose. It also helps to drain fluid from the middle ear space. When the eustachian tube does not function properly, air pressure, fluid, or both can build up in the middle  ear. Eustachian tube dysfunction can affect one or both ears. What are the causes? This condition happens when the eustachian tube becomes blocked or cannot open normally. This may result from:  Ear infections.  Colds and other upper respiratory infections.  Allergies.  Irritation, such as from cigarette smoke or acid from the stomach coming up into the esophagus (gastroesophageal reflux).  Sudden changes in air pressure, such as from descending in an airplane.  Abnormal growths in the nose or throat, such as nasal polyps, tumors, or enlarged tissue at the back of the throat (adenoids).  What increases the risk? This condition may be more likely to develop in people who smoke and people who are overweight. Eustachian tube dysfunction may also be more likely to develop in children, especially children who have:  Certain birth defects of the mouth, such as cleft palate.  Large tonsils and adenoids.  What are the signs or symptoms? Symptoms of this condition may include:  A feeling of  fullness in the ear.  Ear pain.  Clicking or popping noises in the ear.  Ringing in the ear.  Hearing loss.  Loss of balance.  Symptoms may get worse when the air pressure around you changes, such as when you travel to an area of high elevation or fly on an airplane. How is this diagnosed? This condition may be diagnosed based on:  Your symptoms.  A physical exam of your ear, nose, and throat.  Tests, such as those that measure: ? The movement of your eardrum (tympanogram). ? Your hearing (audiometry).  How is this treated? Treatment depends on the cause and severity of your condition. If your symptoms are mild, you may be able to relieve your symptoms by moving air into ("popping") your ears. If you have symptoms of fluid in your ears, treatment may include:  Decongestants.  Antihistamines.  Nasal sprays or ear drops that contain medicines that reduce swelling (steroids).  In some cases, you may need to have a procedure to drain the fluid in your eardrum (myringotomy). In this procedure, a small tube is placed in the eardrum to:  Drain the fluid.  Restore the air in the middle ear space.  Follow these instructions at home:  Take over-the-counter and prescription medicines only as told by your health care provider.  Use techniques to help pop your ears as recommended by your health care provider. These may include: ? Chewing gum. ? Yawning. ? Frequent, forceful swallowing. ? Closing your mouth, holding your nose closed, and gently blowing as if you are trying to blow air out of your nose.  Do not do any of the following until your health care provider approves: ? Travel to high altitudes. ? Fly in airplanes. ? Work in a Estate agentpressurized cabin or room. ? Scuba dive.  Keep your ears dry. Dry your ears completely after showering or bathing.  Do not smoke.  Keep all follow-up visits as told by your health care provider. This is important. Contact a health care  provider if:  Your symptoms do not go away after treatment.  Your symptoms come back after treatment.  You are unable to pop your ears.  You have: ? A fever. ? Pain in your ear. ? Pain in your head or neck. ? Fluid draining from your ear.  Your hearing suddenly changes.  You become very dizzy.  You lose your balance. This information is not intended to replace advice given to you by your health care provider. Make sure you  discuss any questions you have with your health care provider. Document Released: 01/24/2015 Document Revised: 06/05/2015 Document Reviewed: 01/16/2014 Elsevier Interactive Patient Education  [employer].

## 2018-01-12 DIAGNOSIS — M9902 Segmental and somatic dysfunction of thoracic region: Secondary | ICD-10-CM | POA: Diagnosis not present

## 2018-01-12 DIAGNOSIS — M542 Cervicalgia: Secondary | ICD-10-CM | POA: Diagnosis not present

## 2018-01-12 DIAGNOSIS — M9901 Segmental and somatic dysfunction of cervical region: Secondary | ICD-10-CM | POA: Diagnosis not present

## 2018-03-13 DIAGNOSIS — M542 Cervicalgia: Secondary | ICD-10-CM | POA: Diagnosis not present

## 2018-03-13 DIAGNOSIS — M9902 Segmental and somatic dysfunction of thoracic region: Secondary | ICD-10-CM | POA: Diagnosis not present

## 2018-03-13 DIAGNOSIS — M9901 Segmental and somatic dysfunction of cervical region: Secondary | ICD-10-CM | POA: Diagnosis not present

## 2018-03-15 DIAGNOSIS — M9902 Segmental and somatic dysfunction of thoracic region: Secondary | ICD-10-CM | POA: Diagnosis not present

## 2018-03-15 DIAGNOSIS — M542 Cervicalgia: Secondary | ICD-10-CM | POA: Diagnosis not present

## 2018-03-15 DIAGNOSIS — M9901 Segmental and somatic dysfunction of cervical region: Secondary | ICD-10-CM | POA: Diagnosis not present

## 2018-03-21 DIAGNOSIS — M542 Cervicalgia: Secondary | ICD-10-CM | POA: Diagnosis not present

## 2018-03-21 DIAGNOSIS — M9901 Segmental and somatic dysfunction of cervical region: Secondary | ICD-10-CM | POA: Diagnosis not present

## 2018-03-21 DIAGNOSIS — M9902 Segmental and somatic dysfunction of thoracic region: Secondary | ICD-10-CM | POA: Diagnosis not present

## 2018-04-11 DIAGNOSIS — M9902 Segmental and somatic dysfunction of thoracic region: Secondary | ICD-10-CM | POA: Diagnosis not present

## 2018-04-11 DIAGNOSIS — M542 Cervicalgia: Secondary | ICD-10-CM | POA: Diagnosis not present

## 2018-04-11 DIAGNOSIS — M9901 Segmental and somatic dysfunction of cervical region: Secondary | ICD-10-CM | POA: Diagnosis not present

## 2018-04-13 DIAGNOSIS — M9902 Segmental and somatic dysfunction of thoracic region: Secondary | ICD-10-CM | POA: Diagnosis not present

## 2018-04-13 DIAGNOSIS — M542 Cervicalgia: Secondary | ICD-10-CM | POA: Diagnosis not present

## 2018-04-13 DIAGNOSIS — M9901 Segmental and somatic dysfunction of cervical region: Secondary | ICD-10-CM | POA: Diagnosis not present

## 2018-04-18 DIAGNOSIS — M9902 Segmental and somatic dysfunction of thoracic region: Secondary | ICD-10-CM | POA: Diagnosis not present

## 2018-04-18 DIAGNOSIS — M542 Cervicalgia: Secondary | ICD-10-CM | POA: Diagnosis not present

## 2018-04-18 DIAGNOSIS — M9901 Segmental and somatic dysfunction of cervical region: Secondary | ICD-10-CM | POA: Diagnosis not present

## 2018-04-20 DIAGNOSIS — M9902 Segmental and somatic dysfunction of thoracic region: Secondary | ICD-10-CM | POA: Diagnosis not present

## 2018-04-20 DIAGNOSIS — M9901 Segmental and somatic dysfunction of cervical region: Secondary | ICD-10-CM | POA: Diagnosis not present

## 2018-04-20 DIAGNOSIS — M542 Cervicalgia: Secondary | ICD-10-CM | POA: Diagnosis not present

## 2018-04-25 DIAGNOSIS — M542 Cervicalgia: Secondary | ICD-10-CM | POA: Diagnosis not present

## 2018-04-25 DIAGNOSIS — M9901 Segmental and somatic dysfunction of cervical region: Secondary | ICD-10-CM | POA: Diagnosis not present

## 2018-04-25 DIAGNOSIS — M9902 Segmental and somatic dysfunction of thoracic region: Secondary | ICD-10-CM | POA: Diagnosis not present

## 2018-04-27 DIAGNOSIS — M9901 Segmental and somatic dysfunction of cervical region: Secondary | ICD-10-CM | POA: Diagnosis not present

## 2018-04-27 DIAGNOSIS — M542 Cervicalgia: Secondary | ICD-10-CM | POA: Diagnosis not present

## 2018-04-27 DIAGNOSIS — M9902 Segmental and somatic dysfunction of thoracic region: Secondary | ICD-10-CM | POA: Diagnosis not present

## 2018-04-30 IMAGING — CR DG CHEST 2V
2 series · 2 of 2 positions shown · non-contrast
Comparison: May 29, 2014

CLINICAL DATA: Cough and fever.  Cardiac palpitations.

EXAM:
CHEST  2 VIEW

[w chest pa]
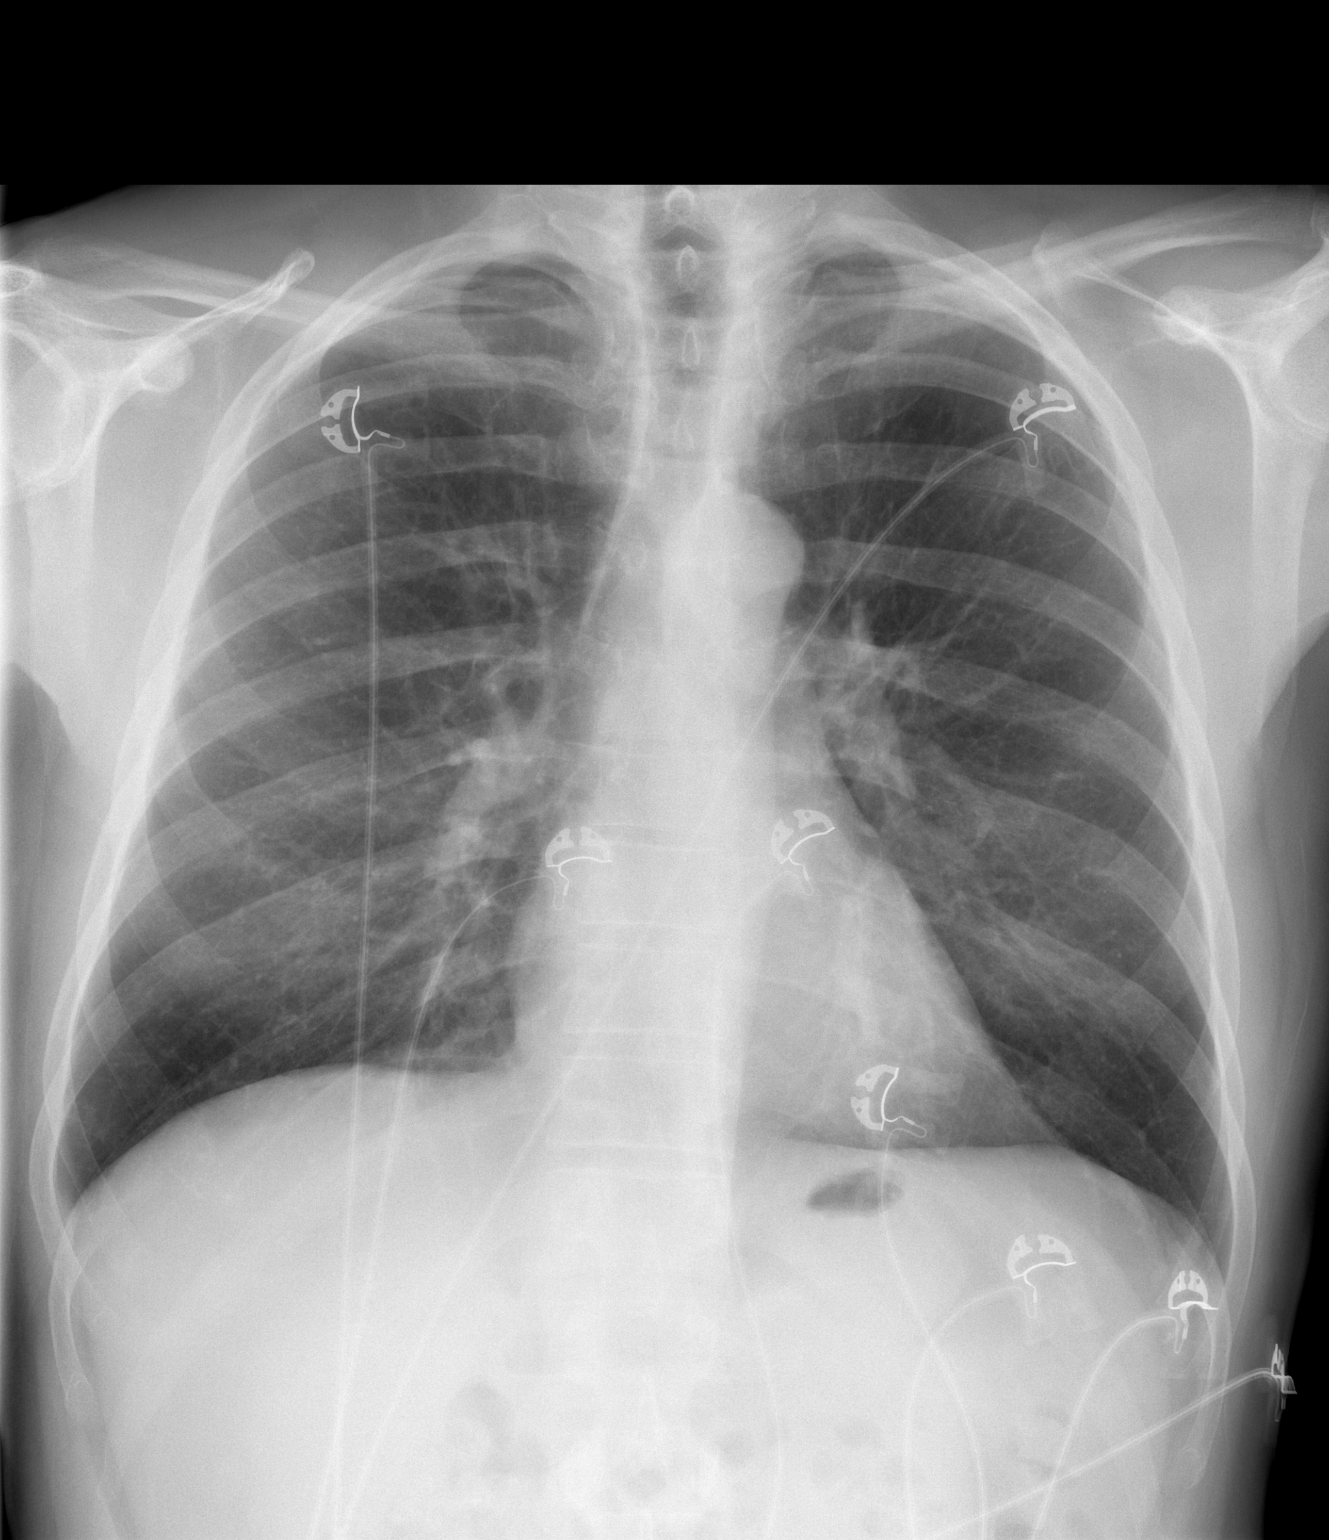

[w chest lat]
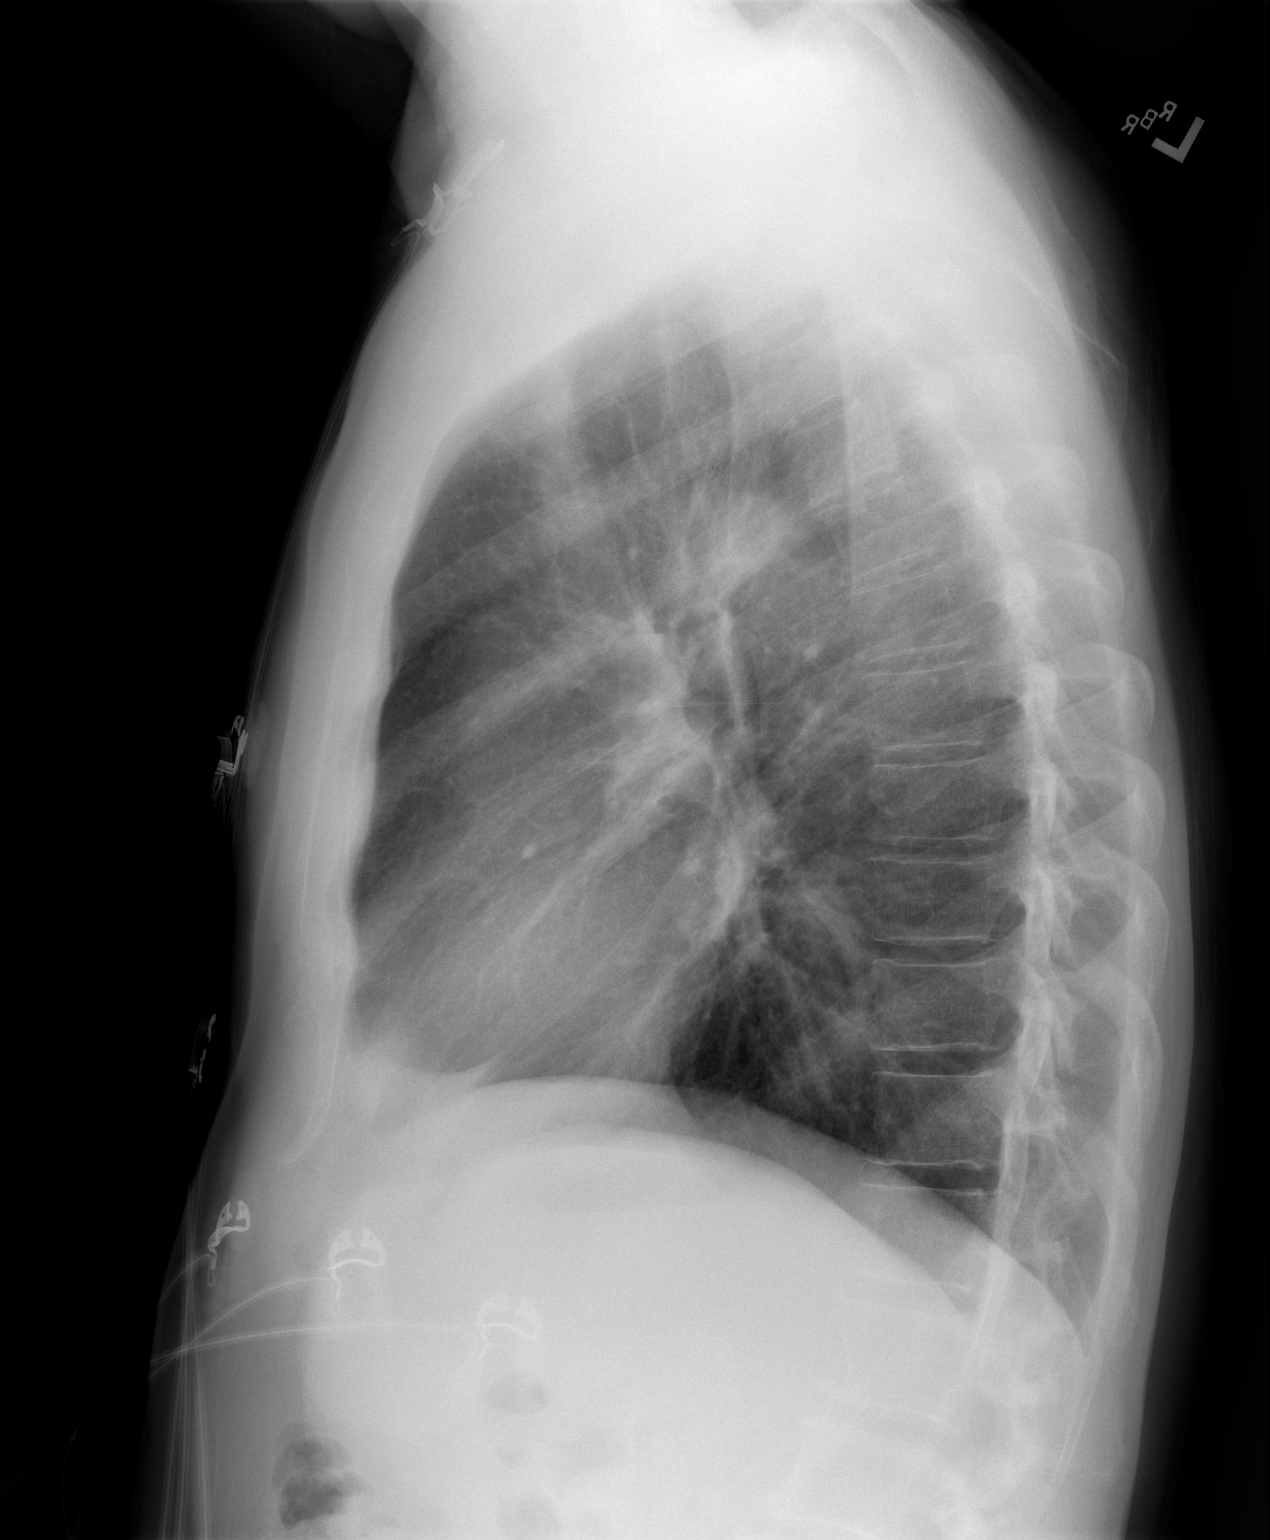

[2 of 2 positions shown; findings below may reference images not displayed]

FINDINGS: There is no edema or consolidation. The heart size and pulmonary
vascularity are normal. No adenopathy. No bone lesions.
IMPRESSION: No edema or consolidation.

## 2018-05-04 DIAGNOSIS — M9901 Segmental and somatic dysfunction of cervical region: Secondary | ICD-10-CM | POA: Diagnosis not present

## 2018-05-04 DIAGNOSIS — M9902 Segmental and somatic dysfunction of thoracic region: Secondary | ICD-10-CM | POA: Diagnosis not present

## 2018-05-04 DIAGNOSIS — M542 Cervicalgia: Secondary | ICD-10-CM | POA: Diagnosis not present

## 2018-05-09 DIAGNOSIS — M9901 Segmental and somatic dysfunction of cervical region: Secondary | ICD-10-CM | POA: Diagnosis not present

## 2018-05-09 DIAGNOSIS — M542 Cervicalgia: Secondary | ICD-10-CM | POA: Diagnosis not present

## 2018-05-09 DIAGNOSIS — M9902 Segmental and somatic dysfunction of thoracic region: Secondary | ICD-10-CM | POA: Diagnosis not present

## 2018-05-11 DIAGNOSIS — M542 Cervicalgia: Secondary | ICD-10-CM | POA: Diagnosis not present

## 2018-05-11 DIAGNOSIS — M9901 Segmental and somatic dysfunction of cervical region: Secondary | ICD-10-CM | POA: Diagnosis not present

## 2018-05-11 DIAGNOSIS — M9902 Segmental and somatic dysfunction of thoracic region: Secondary | ICD-10-CM | POA: Diagnosis not present

## 2018-05-15 DIAGNOSIS — M9901 Segmental and somatic dysfunction of cervical region: Secondary | ICD-10-CM | POA: Diagnosis not present

## 2018-05-15 DIAGNOSIS — M542 Cervicalgia: Secondary | ICD-10-CM | POA: Diagnosis not present

## 2018-05-15 DIAGNOSIS — M9902 Segmental and somatic dysfunction of thoracic region: Secondary | ICD-10-CM | POA: Diagnosis not present

## 2018-05-18 DIAGNOSIS — M9902 Segmental and somatic dysfunction of thoracic region: Secondary | ICD-10-CM | POA: Diagnosis not present

## 2018-05-18 DIAGNOSIS — M542 Cervicalgia: Secondary | ICD-10-CM | POA: Diagnosis not present

## 2018-05-18 DIAGNOSIS — M9901 Segmental and somatic dysfunction of cervical region: Secondary | ICD-10-CM | POA: Diagnosis not present

## 2018-05-22 DIAGNOSIS — M542 Cervicalgia: Secondary | ICD-10-CM | POA: Diagnosis not present

## 2018-05-22 DIAGNOSIS — M9902 Segmental and somatic dysfunction of thoracic region: Secondary | ICD-10-CM | POA: Diagnosis not present

## 2018-05-22 DIAGNOSIS — M9901 Segmental and somatic dysfunction of cervical region: Secondary | ICD-10-CM | POA: Diagnosis not present

## 2018-05-25 DIAGNOSIS — M9902 Segmental and somatic dysfunction of thoracic region: Secondary | ICD-10-CM | POA: Diagnosis not present

## 2018-05-25 DIAGNOSIS — M542 Cervicalgia: Secondary | ICD-10-CM | POA: Diagnosis not present

## 2018-05-25 DIAGNOSIS — M9901 Segmental and somatic dysfunction of cervical region: Secondary | ICD-10-CM | POA: Diagnosis not present

## 2018-05-29 DIAGNOSIS — M542 Cervicalgia: Secondary | ICD-10-CM | POA: Diagnosis not present

## 2018-05-29 DIAGNOSIS — M9902 Segmental and somatic dysfunction of thoracic region: Secondary | ICD-10-CM | POA: Diagnosis not present

## 2018-05-29 DIAGNOSIS — M9901 Segmental and somatic dysfunction of cervical region: Secondary | ICD-10-CM | POA: Diagnosis not present

## 2018-06-01 DIAGNOSIS — M542 Cervicalgia: Secondary | ICD-10-CM | POA: Diagnosis not present

## 2018-06-01 DIAGNOSIS — M9901 Segmental and somatic dysfunction of cervical region: Secondary | ICD-10-CM | POA: Diagnosis not present

## 2018-06-01 DIAGNOSIS — M9902 Segmental and somatic dysfunction of thoracic region: Secondary | ICD-10-CM | POA: Diagnosis not present

## 2018-06-13 DIAGNOSIS — M9901 Segmental and somatic dysfunction of cervical region: Secondary | ICD-10-CM | POA: Diagnosis not present

## 2018-06-13 DIAGNOSIS — M542 Cervicalgia: Secondary | ICD-10-CM | POA: Diagnosis not present

## 2018-06-13 DIAGNOSIS — M9902 Segmental and somatic dysfunction of thoracic region: Secondary | ICD-10-CM | POA: Diagnosis not present

## 2018-06-15 DIAGNOSIS — M542 Cervicalgia: Secondary | ICD-10-CM | POA: Diagnosis not present

## 2018-06-15 DIAGNOSIS — M9901 Segmental and somatic dysfunction of cervical region: Secondary | ICD-10-CM | POA: Diagnosis not present

## 2018-06-15 DIAGNOSIS — M9902 Segmental and somatic dysfunction of thoracic region: Secondary | ICD-10-CM | POA: Diagnosis not present

## 2019-01-25 DIAGNOSIS — M9902 Segmental and somatic dysfunction of thoracic region: Secondary | ICD-10-CM | POA: Diagnosis not present

## 2019-01-25 DIAGNOSIS — M542 Cervicalgia: Secondary | ICD-10-CM | POA: Diagnosis not present

## 2019-01-25 DIAGNOSIS — M9901 Segmental and somatic dysfunction of cervical region: Secondary | ICD-10-CM | POA: Diagnosis not present

## 2019-01-25 DIAGNOSIS — M546 Pain in thoracic spine: Secondary | ICD-10-CM | POA: Diagnosis not present

## 2019-02-22 DIAGNOSIS — M546 Pain in thoracic spine: Secondary | ICD-10-CM | POA: Diagnosis not present

## 2019-02-22 DIAGNOSIS — M9901 Segmental and somatic dysfunction of cervical region: Secondary | ICD-10-CM | POA: Diagnosis not present

## 2019-02-22 DIAGNOSIS — M9902 Segmental and somatic dysfunction of thoracic region: Secondary | ICD-10-CM | POA: Diagnosis not present

## 2019-02-22 DIAGNOSIS — M542 Cervicalgia: Secondary | ICD-10-CM | POA: Diagnosis not present

## 2019-03-29 DIAGNOSIS — M546 Pain in thoracic spine: Secondary | ICD-10-CM | POA: Diagnosis not present

## 2019-03-29 DIAGNOSIS — M542 Cervicalgia: Secondary | ICD-10-CM | POA: Diagnosis not present

## 2019-03-29 DIAGNOSIS — M9902 Segmental and somatic dysfunction of thoracic region: Secondary | ICD-10-CM | POA: Diagnosis not present

## 2019-03-29 DIAGNOSIS — M9901 Segmental and somatic dysfunction of cervical region: Secondary | ICD-10-CM | POA: Diagnosis not present

## 2019-05-17 DIAGNOSIS — M542 Cervicalgia: Secondary | ICD-10-CM | POA: Diagnosis not present

## 2019-05-17 DIAGNOSIS — M9901 Segmental and somatic dysfunction of cervical region: Secondary | ICD-10-CM | POA: Diagnosis not present

## 2019-05-17 DIAGNOSIS — M9902 Segmental and somatic dysfunction of thoracic region: Secondary | ICD-10-CM | POA: Diagnosis not present

## 2019-05-17 DIAGNOSIS — M546 Pain in thoracic spine: Secondary | ICD-10-CM | POA: Diagnosis not present

## 2019-06-21 DIAGNOSIS — M546 Pain in thoracic spine: Secondary | ICD-10-CM | POA: Diagnosis not present

## 2019-06-21 DIAGNOSIS — M9901 Segmental and somatic dysfunction of cervical region: Secondary | ICD-10-CM | POA: Diagnosis not present

## 2019-06-21 DIAGNOSIS — M542 Cervicalgia: Secondary | ICD-10-CM | POA: Diagnosis not present

## 2019-06-21 DIAGNOSIS — M9902 Segmental and somatic dysfunction of thoracic region: Secondary | ICD-10-CM | POA: Diagnosis not present

## 2019-08-02 DIAGNOSIS — M9902 Segmental and somatic dysfunction of thoracic region: Secondary | ICD-10-CM | POA: Diagnosis not present

## 2019-08-02 DIAGNOSIS — M546 Pain in thoracic spine: Secondary | ICD-10-CM | POA: Diagnosis not present

## 2019-08-02 DIAGNOSIS — M542 Cervicalgia: Secondary | ICD-10-CM | POA: Diagnosis not present

## 2019-08-02 DIAGNOSIS — M9901 Segmental and somatic dysfunction of cervical region: Secondary | ICD-10-CM | POA: Diagnosis not present

## 2020-02-28 ENCOUNTER — Other Ambulatory Visit: Payer: Self-pay

## 2020-02-29 ENCOUNTER — Ambulatory Visit: Payer: Federal, State, Local not specified - PPO | Admitting: Nurse Practitioner

## 2022-03-10 ENCOUNTER — Encounter: Payer: Self-pay | Admitting: Gastroenterology
